# Patient Record
Sex: Female | Born: 1945 | Race: Black or African American | Hispanic: No | Marital: Single | Smoking: Former smoker
Health system: Southern US, Community
[De-identification: ages and names within clinical notes are randomized; demographics above are authoritative.]

## PROBLEM LIST (undated history)

## (undated) DIAGNOSIS — K6289 Other specified diseases of anus and rectum: Secondary | ICD-10-CM

## (undated) DIAGNOSIS — D649 Anemia, unspecified: Secondary | ICD-10-CM

## (undated) DIAGNOSIS — I10 Essential (primary) hypertension: Secondary | ICD-10-CM

## (undated) DIAGNOSIS — E785 Hyperlipidemia, unspecified: Secondary | ICD-10-CM

## (undated) DIAGNOSIS — J189 Pneumonia, unspecified organism: Secondary | ICD-10-CM

## (undated) DIAGNOSIS — M1990 Unspecified osteoarthritis, unspecified site: Secondary | ICD-10-CM

## (undated) DIAGNOSIS — M1712 Unilateral primary osteoarthritis, left knee: Secondary | ICD-10-CM

## (undated) HISTORY — DX: Other specified diseases of anus and rectum: K62.89

## (undated) HISTORY — DX: Anemia, unspecified: D64.9

## (undated) HISTORY — PX: COLONOSCOPY: SHX174

## (undated) HISTORY — DX: Essential (primary) hypertension: I10

## (undated) HISTORY — DX: Hyperlipidemia, unspecified: E78.5

---

## 2005-11-15 ENCOUNTER — Ambulatory Visit (HOSPITAL_COMMUNITY): Admission: RE | Admit: 2005-11-15 | Discharge: 2005-11-15 | Payer: Self-pay | Admitting: Gastroenterology

## 2005-11-15 ENCOUNTER — Encounter (INDEPENDENT_AMBULATORY_CARE_PROVIDER_SITE_OTHER): Payer: Self-pay | Admitting: *Deleted

## 2006-06-26 ENCOUNTER — Encounter: Admission: RE | Admit: 2006-06-26 | Discharge: 2006-06-26 | Payer: Self-pay | Admitting: Emergency Medicine

## 2006-11-19 ENCOUNTER — Emergency Department (HOSPITAL_COMMUNITY): Admission: EM | Admit: 2006-11-19 | Discharge: 2006-11-19 | Payer: Self-pay | Admitting: Emergency Medicine

## 2007-07-09 ENCOUNTER — Encounter: Admission: RE | Admit: 2007-07-09 | Discharge: 2007-07-09 | Payer: Self-pay | Admitting: Emergency Medicine

## 2007-07-18 ENCOUNTER — Encounter: Admission: RE | Admit: 2007-07-18 | Discharge: 2007-07-18 | Payer: Self-pay | Admitting: Surgery

## 2008-07-10 ENCOUNTER — Encounter: Admission: RE | Admit: 2008-07-10 | Discharge: 2008-07-10 | Payer: Self-pay | Admitting: Family Medicine

## 2008-07-18 ENCOUNTER — Encounter: Admission: RE | Admit: 2008-07-18 | Discharge: 2008-07-18 | Payer: Self-pay | Admitting: Family Medicine

## 2008-12-17 ENCOUNTER — Ambulatory Visit: Payer: Self-pay | Admitting: Family Medicine

## 2009-02-18 ENCOUNTER — Ambulatory Visit: Payer: Self-pay | Admitting: Family Medicine

## 2009-03-11 ENCOUNTER — Ambulatory Visit: Payer: Self-pay | Admitting: Family Medicine

## 2009-03-11 ENCOUNTER — Encounter: Admission: RE | Admit: 2009-03-11 | Discharge: 2009-03-11 | Payer: Self-pay | Admitting: Gastroenterology

## 2009-03-26 LAB — HM COLONOSCOPY

## 2009-04-01 ENCOUNTER — Encounter: Admission: RE | Admit: 2009-04-01 | Discharge: 2009-04-01 | Payer: Self-pay | Admitting: Gastroenterology

## 2009-06-10 ENCOUNTER — Ambulatory Visit: Payer: Self-pay | Admitting: Family Medicine

## 2009-10-08 ENCOUNTER — Ambulatory Visit: Payer: Self-pay | Admitting: Family Medicine

## 2009-10-09 ENCOUNTER — Ambulatory Visit: Payer: Self-pay | Admitting: Internal Medicine

## 2009-12-25 ENCOUNTER — Encounter
Admission: RE | Admit: 2009-12-25 | Discharge: 2009-12-25 | Payer: Self-pay | Source: Home / Self Care | Attending: Family Medicine | Admitting: Family Medicine

## 2009-12-25 LAB — HM MAMMOGRAPHY: HM Mammogram: NEGATIVE

## 2010-01-01 ENCOUNTER — Ambulatory Visit: Payer: Self-pay | Admitting: Family Medicine

## 2010-02-08 ENCOUNTER — Encounter: Payer: Self-pay | Admitting: Family Medicine

## 2010-05-04 ENCOUNTER — Encounter (INDEPENDENT_AMBULATORY_CARE_PROVIDER_SITE_OTHER): Payer: PRIVATE HEALTH INSURANCE | Admitting: Family Medicine

## 2010-05-04 DIAGNOSIS — E119 Type 2 diabetes mellitus without complications: Secondary | ICD-10-CM

## 2010-05-04 DIAGNOSIS — E785 Hyperlipidemia, unspecified: Secondary | ICD-10-CM

## 2010-05-04 DIAGNOSIS — I10 Essential (primary) hypertension: Secondary | ICD-10-CM

## 2010-05-04 DIAGNOSIS — D649 Anemia, unspecified: Secondary | ICD-10-CM

## 2010-05-04 DIAGNOSIS — Z79899 Other long term (current) drug therapy: Secondary | ICD-10-CM

## 2010-06-21 ENCOUNTER — Encounter: Payer: Self-pay | Admitting: Family Medicine

## 2010-08-16 ENCOUNTER — Encounter: Payer: Self-pay | Admitting: Family Medicine

## 2010-08-18 ENCOUNTER — Encounter: Payer: Self-pay | Admitting: Family Medicine

## 2010-08-18 ENCOUNTER — Ambulatory Visit (INDEPENDENT_AMBULATORY_CARE_PROVIDER_SITE_OTHER): Payer: PRIVATE HEALTH INSURANCE | Admitting: Family Medicine

## 2010-08-18 DIAGNOSIS — E785 Hyperlipidemia, unspecified: Secondary | ICD-10-CM | POA: Insufficient documentation

## 2010-08-18 DIAGNOSIS — E0840 Diabetes mellitus due to underlying condition with diabetic neuropathy, unspecified: Secondary | ICD-10-CM | POA: Insufficient documentation

## 2010-08-18 DIAGNOSIS — B351 Tinea unguium: Secondary | ICD-10-CM

## 2010-08-18 DIAGNOSIS — N898 Other specified noninflammatory disorders of vagina: Secondary | ICD-10-CM

## 2010-08-18 DIAGNOSIS — E119 Type 2 diabetes mellitus without complications: Secondary | ICD-10-CM

## 2010-08-18 DIAGNOSIS — E1169 Type 2 diabetes mellitus with other specified complication: Secondary | ICD-10-CM

## 2010-08-18 DIAGNOSIS — Z79899 Other long term (current) drug therapy: Secondary | ICD-10-CM

## 2010-08-18 DIAGNOSIS — D649 Anemia, unspecified: Secondary | ICD-10-CM

## 2010-08-18 DIAGNOSIS — I10 Essential (primary) hypertension: Secondary | ICD-10-CM

## 2010-08-18 LAB — CBC WITH DIFFERENTIAL/PLATELET
Basophils Absolute: 0 10*3/uL (ref 0.0–0.1)
Eosinophils Relative: 6 % — ABNORMAL HIGH (ref 0–5)
Lymphocytes Relative: 42 % (ref 12–46)
MCV: 101.7 fL — ABNORMAL HIGH (ref 78.0–100.0)
Neutro Abs: 1.9 10*3/uL (ref 1.7–7.7)
Neutrophils Relative %: 39 % — ABNORMAL LOW (ref 43–77)
Platelets: 427 10*3/uL — ABNORMAL HIGH (ref 150–400)
RBC: 2.89 MIL/uL — ABNORMAL LOW (ref 3.87–5.11)
RDW: 17.2 % — ABNORMAL HIGH (ref 11.5–15.5)
WBC: 4.9 10*3/uL (ref 4.0–10.5)

## 2010-08-18 LAB — LIPID PANEL
Cholesterol: 165 mg/dL (ref 0–200)
VLDL: 9 mg/dL (ref 0–40)

## 2010-08-18 MED ORDER — METFORMIN HCL 500 MG PO TABS: 500.0000 mg | ORAL_TABLET | Freq: Two times a day (BID) | ORAL | Status: DC

## 2010-08-18 NOTE — Progress Notes (Signed)
  Subjective:    Patient ID: Christine Silva, female    DOB: 12-06-1945, 65 y.o.   MRN: [id number]  HPI She is here for a diabetes recheck. She continues on medications listed in the chart. She has a history of anemia and is on a multivitamin. She is now taking Zantac to help with abdominal distress. It is working well for this. She also complains of a three-week history of vaginal discomfort and tenderness to palpation mainly on the left. She states that her blood sugars do fluctuate especially in the mornings. She also complains of thickening of her toenails. Her review of systems is otherwise negative.   Review of Systems     Objective:   Physical Exam Alert and in no distress. Vaginal exam does show a whitish ulcerated appearing lesion left of the urethra. Hemoglobin A1c in the office was in the low 4 range. Exam of her feet does show thickening of several of the nails.       Assessment & Plan:  Diabetes. Anemia. Hypertension. Hyperlipidemia. Onychomycosis. Vaginal lesion I will refer to gynecology to further evaluate the lesion. Routine blood screening and will recheck hemoglobin A1c. Discussed treatment of her toenails and at this time no further therapy will be initiated.

## 2010-08-18 NOTE — Patient Instructions (Signed)
Continue on present medications. We will refer you to gynecology.

## 2010-08-19 LAB — HEMOGLOBIN A1C: Hgb A1c MFr Bld: 4.3 % (ref <5.7)

## 2010-09-14 ENCOUNTER — Other Ambulatory Visit: Payer: Self-pay | Admitting: Obstetrics & Gynecology

## 2010-11-11 ENCOUNTER — Ambulatory Visit (INDEPENDENT_AMBULATORY_CARE_PROVIDER_SITE_OTHER): Payer: PRIVATE HEALTH INSURANCE | Admitting: Family Medicine

## 2010-11-11 ENCOUNTER — Encounter: Payer: Self-pay | Admitting: Family Medicine

## 2010-11-11 DIAGNOSIS — L03019 Cellulitis of unspecified finger: Secondary | ICD-10-CM

## 2010-11-11 NOTE — Progress Notes (Signed)
  Subjective:    Patient ID: Christine Silva, female    DOB: May 26, 1945, 65 y.o.   MRN: [id number]  HPI She is here for consultation concerning difficulty with her right thumb. Apparently at approximately 10 days ago she started having difficulty with swelling and made an attempt to remove something from her thumb. She mentioned previous difficulty with the, however history was difficult to obtain due to her accident. She does have underlying diabetes.   Review of Systems     Objective:   Physical Exam Exam of the right thumb does show erythema with recent trauma to the palmar surface. It is also swollen but nontender.       Assessment & Plan:  Possible, infection in this diabetic. I will refer to hand surgeon.

## 2010-11-16 ENCOUNTER — Encounter: Payer: Self-pay | Admitting: Family Medicine

## 2010-12-06 ENCOUNTER — Telehealth: Payer: Self-pay | Admitting: Family Medicine

## 2010-12-06 MED ORDER — MELOXICAM 15 MG PO TABS: 15.0000 mg | ORAL_TABLET | Freq: Every day | ORAL | Status: DC

## 2010-12-06 NOTE — Telephone Encounter (Signed)
Is this okay to refill? 

## 2010-12-06 NOTE — Telephone Encounter (Signed)
Refilled mexloxicam thru [facility]

## 2010-12-06 NOTE — Telephone Encounter (Signed)
Renew the meloxicam

## 2011-01-06 ENCOUNTER — Ambulatory Visit (INDEPENDENT_AMBULATORY_CARE_PROVIDER_SITE_OTHER): Payer: PRIVATE HEALTH INSURANCE | Admitting: Family Medicine

## 2011-01-06 ENCOUNTER — Encounter: Payer: Self-pay | Admitting: Family Medicine

## 2011-01-06 VITALS — BP 120/70 | HR 67 | Wt 172.0 lb

## 2011-01-06 DIAGNOSIS — E785 Hyperlipidemia, unspecified: Secondary | ICD-10-CM

## 2011-01-06 DIAGNOSIS — E1149 Type 2 diabetes mellitus with other diabetic neurological complication: Secondary | ICD-10-CM

## 2011-01-06 DIAGNOSIS — E0840 Diabetes mellitus due to underlying condition with diabetic neuropathy, unspecified: Secondary | ICD-10-CM

## 2011-01-06 DIAGNOSIS — D649 Anemia, unspecified: Secondary | ICD-10-CM

## 2011-01-06 DIAGNOSIS — I10 Essential (primary) hypertension: Secondary | ICD-10-CM

## 2011-01-06 DIAGNOSIS — E1169 Type 2 diabetes mellitus with other specified complication: Secondary | ICD-10-CM

## 2011-01-06 DIAGNOSIS — E1159 Type 2 diabetes mellitus with other circulatory complications: Secondary | ICD-10-CM

## 2011-01-06 DIAGNOSIS — E119 Type 2 diabetes mellitus without complications: Secondary | ICD-10-CM

## 2011-01-06 DIAGNOSIS — E1142 Type 2 diabetes mellitus with diabetic polyneuropathy: Secondary | ICD-10-CM

## 2011-01-06 DIAGNOSIS — E1349 Other specified diabetes mellitus with other diabetic neurological complication: Secondary | ICD-10-CM

## 2011-01-06 LAB — CBC WITH DIFFERENTIAL/PLATELET
HCT: 28.5 % — ABNORMAL LOW (ref 36.0–46.0)
Hemoglobin: 9 g/dL — ABNORMAL LOW (ref 12.0–15.0)
Lymphs Abs: 1.7 10*3/uL (ref 0.7–4.0)
MCH: 32.4 pg (ref 26.0–34.0)
Monocytes Relative: 13 % — ABNORMAL HIGH (ref 3–12)
Neutro Abs: 2 10*3/uL (ref 1.7–7.7)
Neutrophils Relative %: 43 % (ref 43–77)
RBC: 2.78 MIL/uL — ABNORMAL LOW (ref 3.87–5.11)

## 2011-01-06 NOTE — Progress Notes (Signed)
  Subjective:    Patient ID: Christine Silva, female    DOB: 01-14-1946, 65 y.o.   MRN: [id number]  HPI She is here for a diabetes followup. She continues on her insulin and other medications. She does periodically check her blood sugars. She keeps herself very active. She does not smoke or drink. She had an eye exam in September. She does take multivitamins. She is planning a cruise in the [place] in the near future.   Review of Systems     Objective:   Physical Exam Alert and in no distress. Exam of her feet does show 1+ pulses with absence of ankle reflex. Her station is normal. Skin is normal.       Assessment & Plan:   1. Type II or unspecified type diabetes mellitus without mention of complication, not stated as uncontrolled  Hemoglobin A1c  2. Anemia  CBC with Differential  3. Hyperlipidemia LDL goal <70    4. Hypertension associated with diabetes    5. Diabetes mellitus due to underlying condition with diabetic neuropathy     encouraged her to continue to take excellent care of her self.

## 2011-01-07 ENCOUNTER — Telehealth: Payer: Self-pay

## 2011-01-07 LAB — HEMOGLOBIN A1C: Mean Plasma Glucose: 97 mg/dL (ref <117)

## 2011-01-07 NOTE — Telephone Encounter (Signed)
Pt needs letter to get meds on plane call when ready

## 2011-01-07 NOTE — Telephone Encounter (Signed)
Letter complete.

## 2011-02-08 ENCOUNTER — Other Ambulatory Visit: Payer: Self-pay | Admitting: Family Medicine

## 2011-02-27 ENCOUNTER — Other Ambulatory Visit: Payer: Self-pay | Admitting: Family Medicine

## 2011-03-03 ENCOUNTER — Other Ambulatory Visit: Payer: Self-pay | Admitting: Family Medicine

## 2011-03-03 DIAGNOSIS — Z1231 Encounter for screening mammogram for malignant neoplasm of breast: Secondary | ICD-10-CM

## 2011-04-01 ENCOUNTER — Other Ambulatory Visit: Payer: Self-pay | Admitting: Family Medicine

## 2011-04-01 NOTE — Telephone Encounter (Signed)
Is this okay to refill meds? Also needs a refill on metformin

## 2011-04-06 ENCOUNTER — Ambulatory Visit
Admission: RE | Admit: 2011-04-06 | Discharge: 2011-04-06 | Disposition: A | Discharge status: Home / Self Care | Payer: PRIVATE HEALTH INSURANCE | Source: Ambulatory Visit | Attending: Family Medicine | Admitting: Family Medicine

## 2011-04-06 DIAGNOSIS — Z1231 Encounter for screening mammogram for malignant neoplasm of breast: Secondary | ICD-10-CM

## 2011-04-11 ENCOUNTER — Other Ambulatory Visit: Payer: Self-pay | Admitting: Family Medicine

## 2011-04-11 DIAGNOSIS — R928 Other abnormal and inconclusive findings on diagnostic imaging of breast: Secondary | ICD-10-CM

## 2011-04-14 ENCOUNTER — Ambulatory Visit
Admission: RE | Admit: 2011-04-14 | Discharge: 2011-04-14 | Disposition: A | Discharge status: Home / Self Care | Payer: PRIVATE HEALTH INSURANCE | Source: Ambulatory Visit | Attending: Family Medicine | Admitting: Family Medicine

## 2011-04-14 DIAGNOSIS — R928 Other abnormal and inconclusive findings on diagnostic imaging of breast: Secondary | ICD-10-CM

## 2011-04-29 ENCOUNTER — Encounter: Payer: Self-pay | Admitting: Family Medicine

## 2011-04-29 ENCOUNTER — Ambulatory Visit (INDEPENDENT_AMBULATORY_CARE_PROVIDER_SITE_OTHER): Payer: PRIVATE HEALTH INSURANCE | Admitting: Family Medicine

## 2011-04-29 ENCOUNTER — Ambulatory Visit
Admission: RE | Admit: 2011-04-29 | Discharge: 2011-04-29 | Disposition: A | Discharge status: Home / Self Care | Payer: PRIVATE HEALTH INSURANCE | Source: Ambulatory Visit | Attending: Family Medicine | Admitting: Family Medicine

## 2011-04-29 VITALS — BP 110/60 | HR 72 | Ht 64.0 in | Wt 168.0 lb

## 2011-04-29 DIAGNOSIS — M129 Arthropathy, unspecified: Secondary | ICD-10-CM

## 2011-04-29 DIAGNOSIS — K219 Gastro-esophageal reflux disease without esophagitis: Secondary | ICD-10-CM

## 2011-04-29 DIAGNOSIS — E1169 Type 2 diabetes mellitus with other specified complication: Secondary | ICD-10-CM

## 2011-04-29 DIAGNOSIS — E785 Hyperlipidemia, unspecified: Secondary | ICD-10-CM

## 2011-04-29 DIAGNOSIS — M1990 Unspecified osteoarthritis, unspecified site: Secondary | ICD-10-CM

## 2011-04-29 DIAGNOSIS — I10 Essential (primary) hypertension: Secondary | ICD-10-CM

## 2011-04-29 DIAGNOSIS — E119 Type 2 diabetes mellitus without complications: Secondary | ICD-10-CM

## 2011-04-29 MED ORDER — INSULIN GLARGINE 100 UNIT/ML ~~LOC~~ SOLN: 10.0000 [IU] | Freq: Every day | SUBCUTANEOUS | Status: DC

## 2011-04-29 MED ORDER — LOSARTAN POTASSIUM-HCTZ 50-12.5 MG PO TABS: 1.0000 | ORAL_TABLET | Freq: Every day | ORAL | Status: DC

## 2011-04-29 MED ORDER — METFORMIN HCL 500 MG PO TABS: 500.0000 mg | ORAL_TABLET | Freq: Two times a day (BID) | ORAL | Status: DC

## 2011-04-29 MED ORDER — PRAVASTATIN SODIUM 40 MG PO TABS: 40.0000 mg | ORAL_TABLET | Freq: Every day | ORAL | Status: DC

## 2011-04-29 MED ORDER — DICLOFENAC SODIUM 75 MG PO TBEC: 75.0000 mg | DELAYED_RELEASE_TABLET | Freq: Two times a day (BID) | ORAL | Status: DC

## 2011-04-29 MED ORDER — RANITIDINE HCL 150 MG PO TABS: 150.0000 mg | ORAL_TABLET | Freq: Every day | ORAL | Status: DC

## 2011-04-29 NOTE — Progress Notes (Signed)
  Subjective:    Patient ID: Christine Silva, female    DOB: Sep 02, 1945, 66 y.o.   MRN: [id number]  HPI She is here for recheck. She continues to complain of bilateral knee pain as well as wrist and elbow discomfort. She states that the meloxicam is not helping control her symptoms. She continues on her reflux medicine and uses this as needed. She does check her blood sugars fairly regularly. She continues to work and is quite active. Smoking and drinking were reviewed. She did have a mammogram and is scheduled to return in 6 months for recheck.   Review of Systems     Objective:   Physical Exam Alert and in no distress. Cardiac exam shows regular rhythm without murmurs or gallops. Lungs clear to auscultation. Hemoglobin A1c is 4.7. Review of record indicates previous A1c was 5.0 Hemoglobin A1c 4.7       Assessment & Plan:   1. Type II or unspecified type diabetes mellitus without mention of complication, not stated as uncontrolled  POCT HgB A1C, metFORMIN (GLUCOPHAGE) 500 MG tablet  2. Hyperlipidemia LDL goal <70  pravastatin (PRAVACHOL) 40 MG tablet  3. Hypertension associated with diabetes  losartan-hydrochlorothiazide (HYZAAR) 50-12.5 MG per tablet  4. Arthritis  DG Knee 1-2 Views Left, DG Knee 1-2 Views Right, diclofenac (VOLTAREN) 75 MG EC tablet  5. GERD (gastroesophageal reflux disease)  ranitidine (ZANTAC) 150 MG tablet   I will have her back off on Lantus to 10 units and call me with her blood sugar readings in one week. I will switch her to Voltaren. Discussed degenerative joint disease and the possibility of joint replacement at some point in the future. Recheck here in several months.

## 2011-05-16 ENCOUNTER — Telehealth: Payer: Self-pay

## 2011-05-16 NOTE — Telephone Encounter (Signed)
Pt sent B/S readings jcl wanted to know how she was doing I called and left messge for pt to ask how she was doing

## 2011-05-19 ENCOUNTER — Telehealth: Payer: Self-pay

## 2011-05-19 NOTE — Telephone Encounter (Signed)
Pt called and said that she was ok and that she went back to 20 units said 10 units made her B/S reading were up and down and on 20 units it is steady

## 2011-06-02 ENCOUNTER — Other Ambulatory Visit: Payer: Self-pay | Admitting: Family Medicine

## 2011-07-22 ENCOUNTER — Encounter: Payer: Self-pay | Admitting: Family Medicine

## 2011-08-23 ENCOUNTER — Encounter: Payer: Self-pay | Admitting: Internal Medicine

## 2011-08-23 ENCOUNTER — Encounter: Payer: Self-pay | Admitting: Family Medicine

## 2011-08-23 ENCOUNTER — Ambulatory Visit (INDEPENDENT_AMBULATORY_CARE_PROVIDER_SITE_OTHER): Payer: PRIVATE HEALTH INSURANCE | Admitting: Family Medicine

## 2011-08-23 VITALS — BP 100/50 | HR 60 | Wt 161.0 lb

## 2011-08-23 DIAGNOSIS — E1159 Type 2 diabetes mellitus with other circulatory complications: Secondary | ICD-10-CM

## 2011-08-23 DIAGNOSIS — E1169 Type 2 diabetes mellitus with other specified complication: Secondary | ICD-10-CM

## 2011-08-23 DIAGNOSIS — E785 Hyperlipidemia, unspecified: Secondary | ICD-10-CM

## 2011-08-23 DIAGNOSIS — M1990 Unspecified osteoarthritis, unspecified site: Secondary | ICD-10-CM

## 2011-08-23 DIAGNOSIS — M25569 Pain in unspecified knee: Secondary | ICD-10-CM

## 2011-08-23 DIAGNOSIS — Z79899 Other long term (current) drug therapy: Secondary | ICD-10-CM

## 2011-08-23 DIAGNOSIS — M129 Arthropathy, unspecified: Secondary | ICD-10-CM

## 2011-08-23 DIAGNOSIS — D649 Anemia, unspecified: Secondary | ICD-10-CM

## 2011-08-23 DIAGNOSIS — I10 Essential (primary) hypertension: Secondary | ICD-10-CM

## 2011-08-23 DIAGNOSIS — K6289 Other specified diseases of anus and rectum: Secondary | ICD-10-CM

## 2011-08-23 DIAGNOSIS — E119 Type 2 diabetes mellitus without complications: Secondary | ICD-10-CM

## 2011-08-23 LAB — CBC WITH DIFFERENTIAL/PLATELET
HCT: 27.1 % — ABNORMAL LOW (ref 36.0–46.0)
Hemoglobin: 9.1 g/dL — ABNORMAL LOW (ref 12.0–15.0)
Lymphocytes Relative: 43 % (ref 12–46)
Lymphs Abs: 2 10*3/uL (ref 0.7–4.0)
MCHC: 33.6 g/dL (ref 30.0–36.0)
Monocytes Absolute: 0.5 10*3/uL (ref 0.1–1.0)
Monocytes Relative: 11 % (ref 3–12)
Neutro Abs: 1.8 10*3/uL (ref 1.7–7.7)
Neutrophils Relative %: 39 % — ABNORMAL LOW (ref 43–77)
RBC: 2.83 MIL/uL — ABNORMAL LOW (ref 3.87–5.11)
WBC: 4.6 10*3/uL (ref 4.0–10.5)

## 2011-08-23 LAB — LIPID PANEL
HDL: 64 mg/dL (ref >39)
LDL Cholesterol: 87 mg/dL (ref 0–99)
Triglycerides: 54 mg/dL (ref <150)

## 2011-08-23 LAB — COMPREHENSIVE METABOLIC PANEL
Albumin: 4.1 g/dL (ref 3.5–5.2)
BUN: 18 mg/dL (ref 6–23)
CO2: 26 mEq/L (ref 19–32)
Calcium: 9.8 mg/dL (ref 8.4–10.5)
Chloride: 103 mEq/L (ref 96–112)
Glucose, Bld: 129 mg/dL — ABNORMAL HIGH (ref 70–99)
Potassium: 4.1 mEq/L (ref 3.5–5.3)
Sodium: 138 mEq/L (ref 135–145)
Total Protein: 6.3 g/dL (ref 6.0–8.3)

## 2011-08-23 LAB — POCT GLYCOSYLATED HEMOGLOBIN (HGB A1C): Hemoglobin A1C: 4.8

## 2011-08-23 MED ORDER — DICLOFENAC SODIUM 1 % TD GEL: 1.0000 "application " | Freq: Four times a day (QID) | TRANSDERMAL | Status: DC

## 2011-08-23 NOTE — Patient Instructions (Addendum)
Stop the Lantus but continue the metformin twice per day. He checking your blood sugars and if they start to get above 180 let me know

## 2011-08-23 NOTE — Progress Notes (Signed)
  Subjective:    Patient ID: Christine Silva, female    DOB: 05-11-1945, 66 y.o.   MRN: [id number]  HPI He is here for recheck. She continues on medications listed in the chart. She states that the Voltaire and has not helped with her knee pain. Review of the x-rays indicates arthritic changes. She is getting ready to go on a trip and would like help with this. Her blood sugars are doing quite well. She has an eye exam set up in September. She does check her feet. Smoking and drinking were reviewed. Her medical record also indicates a slightly low hemoglobin.  Review of Systems     Objective:   Physical Exam Alert and in no distress. Hemoglobin A1c is 4.8. Left knee exam does show an effusion. There is evidence of a popliteal cyst with swelling in the popliteal area and slight tenderness. No laxity noted.       Assessment & Plan:   1. Diabetes mellitus  POCT glycosylated hemoglobin (Hb A1C), Lipid panel  2. Arthritis  diclofenac sodium (VOLTAREN) 1 % GEL  3. Hyperlipidemia LDL goal <70  Lipid panel  4. Hypertension associated with diabetes  CBC with Differential, Comprehensive metabolic panel  5. Anemia  CBC with Differential  6. Encounter for long-term (current) use of other medications  CBC with Differential, Comprehensive metabolic panel, Lipid panel   I discussed options concerning her knee pain. We've decided to inject this. 40 mg of Kenalog and 3 cc of Xylocaine was injected into the knee laterally. It was difficult to do due to patient's movement however she did obtain relief of her pain indicating penetration into the joint space. Discussed the fact that we will see how long the pain relief lasts. Discussed possible referral to orthopedics depending upon her response. Also recommend stopping her Lantus since her A1c is doing quite well. She is to keep track of her blood sugars.

## 2011-08-24 ENCOUNTER — Telehealth: Payer: Self-pay

## 2011-08-24 NOTE — Telephone Encounter (Signed)
Pt called and said that B/S was 230 this morning what should she do because she said you told her if it got above 150 to let you know please advise

## 2011-08-24 NOTE — Progress Notes (Signed)
Left message labs look good and to stay on a GOOD multi with iron

## 2011-08-25 NOTE — Telephone Encounter (Signed)
Her body might need to readjust since she's no longer taking the Lantus. Have her watch this through the weekend and call Monday to let me know what the numbers are.

## 2011-08-25 NOTE — Telephone Encounter (Signed)
Called pt cell # left message to keep up with numbers over weekend to call me and let me know what they are Monday it may just be her body getting situated due to being of insulin

## 2011-08-29 ENCOUNTER — Telehealth: Payer: Self-pay

## 2011-08-29 NOTE — Telephone Encounter (Signed)
Dr.Lalonde Nicole Kindred called and said B/S readings fri/[id number] please advise

## 2011-08-29 NOTE — Telephone Encounter (Signed)
Have her start back on the insulin 10 unit

## 2011-08-29 NOTE — Telephone Encounter (Signed)
Called pt back to inform her to go back on her insulin left message for pt

## 2011-08-31 ENCOUNTER — Telehealth: Payer: Self-pay

## 2011-08-31 NOTE — Telephone Encounter (Signed)
Called pt at work I have left pt 3 messages to go back on her insulin per JCL 10 units at night due to high B/S readings called pt at work and pt verbalized understanding

## 2011-09-16 ENCOUNTER — Other Ambulatory Visit: Payer: Self-pay | Admitting: Family Medicine

## 2011-09-16 DIAGNOSIS — R921 Mammographic calcification found on diagnostic imaging of breast: Secondary | ICD-10-CM

## 2011-09-20 ENCOUNTER — Ambulatory Visit
Admission: RE | Admit: 2011-09-20 | Discharge: 2011-09-20 | Disposition: A | Discharge status: Home / Self Care | Payer: PRIVATE HEALTH INSURANCE | Source: Ambulatory Visit | Attending: Family Medicine | Admitting: Family Medicine

## 2011-09-20 DIAGNOSIS — R921 Mammographic calcification found on diagnostic imaging of breast: Secondary | ICD-10-CM

## 2011-09-27 ENCOUNTER — Encounter: Payer: Self-pay | Admitting: Family Medicine

## 2011-09-27 ENCOUNTER — Ambulatory Visit (INDEPENDENT_AMBULATORY_CARE_PROVIDER_SITE_OTHER): Payer: PRIVATE HEALTH INSURANCE | Admitting: Family Medicine

## 2011-09-27 VITALS — BP 110/60 | HR 60 | Wt 161.0 lb

## 2011-09-27 DIAGNOSIS — M25562 Pain in left knee: Secondary | ICD-10-CM

## 2011-09-27 DIAGNOSIS — M25569 Pain in unspecified knee: Secondary | ICD-10-CM

## 2011-09-27 MED ORDER — TRAMADOL HCL 50 MG PO TABS: 50.0000 mg | ORAL_TABLET | Freq: Three times a day (TID) | ORAL | Status: AC | PRN

## 2011-09-27 NOTE — Progress Notes (Signed)
  Subjective:    Patient ID: Christine Silva, female    DOB: 06-Jul-1945, 66 y.o.   MRN: [id number]  HPI She is here for recheck. She states that the ingestion gave her pain relief for only 2 days. Since then she has continued had difficulty especially in the posterior aspect of her knee. Pain occurs with any kind of motion.   Review of Systems     Objective:   Physical Exam Pain on motion of the knee with no palpable tenderness. No effusion is noted. No laxity is noted.       Assessment & Plan:   1. Left knee pain  traMADol (ULTRAM) 50 MG tablet   I explained that since the injection really didn't last for a long, repeat would not be that useful and she agrees. Discussed orthopedic referral however she is going back to [place] for several weeks. I will give her tramadol and refer her to orthopedics. She is to call me tomorrow and let me know how the pain medicine works

## 2011-09-27 NOTE — Patient Instructions (Signed)
Call me tomorrow and let me know how the pain medication works

## 2011-09-28 ENCOUNTER — Telehealth: Payer: Self-pay | Admitting: Family Medicine

## 2011-09-28 NOTE — Telephone Encounter (Signed)
Patient states she was told to call today to update on how she is doing She states she is the same, meds are not helping  Please call

## 2011-09-29 NOTE — Telephone Encounter (Signed)
Patient will make appt when she gets back from trip

## 2011-09-29 NOTE — Telephone Encounter (Signed)
She is apparently planning a trip tell her that we can set up an appointment based on that

## 2011-12-01 ENCOUNTER — Other Ambulatory Visit: Payer: Self-pay | Admitting: Family Medicine

## 2011-12-22 ENCOUNTER — Ambulatory Visit (INDEPENDENT_AMBULATORY_CARE_PROVIDER_SITE_OTHER): Payer: PRIVATE HEALTH INSURANCE | Admitting: Family Medicine

## 2011-12-22 ENCOUNTER — Encounter: Payer: Self-pay | Admitting: Family Medicine

## 2011-12-22 VITALS — BP 130/70 | HR 92 | Temp 98.1°F | Resp 16 | Wt 160.0 lb

## 2011-12-22 DIAGNOSIS — E785 Hyperlipidemia, unspecified: Secondary | ICD-10-CM

## 2011-12-22 DIAGNOSIS — M1990 Unspecified osteoarthritis, unspecified site: Secondary | ICD-10-CM

## 2011-12-22 DIAGNOSIS — D649 Anemia, unspecified: Secondary | ICD-10-CM

## 2011-12-22 DIAGNOSIS — M129 Arthropathy, unspecified: Secondary | ICD-10-CM

## 2011-12-22 DIAGNOSIS — I10 Essential (primary) hypertension: Secondary | ICD-10-CM

## 2011-12-22 DIAGNOSIS — E1142 Type 2 diabetes mellitus with diabetic polyneuropathy: Secondary | ICD-10-CM

## 2011-12-22 DIAGNOSIS — E1169 Type 2 diabetes mellitus with other specified complication: Secondary | ICD-10-CM

## 2011-12-22 DIAGNOSIS — E0840 Diabetes mellitus due to underlying condition with diabetic neuropathy, unspecified: Secondary | ICD-10-CM

## 2011-12-22 DIAGNOSIS — E1349 Other specified diabetes mellitus with other diabetic neurological complication: Secondary | ICD-10-CM

## 2011-12-22 LAB — CBC WITH DIFFERENTIAL/PLATELET
Basophils Relative: 1 % (ref 0–1)
HCT: 27.4 % — ABNORMAL LOW (ref 36.0–46.0)
Hemoglobin: 9.4 g/dL — ABNORMAL LOW (ref 12.0–15.0)
Lymphs Abs: 1.7 10*3/uL (ref 0.7–4.0)
MCH: 32.6 pg (ref 26.0–34.0)
MCHC: 34.3 g/dL (ref 30.0–36.0)
Monocytes Absolute: 0.6 10*3/uL (ref 0.1–1.0)
Monocytes Relative: 11 % (ref 3–12)
Neutro Abs: 2.6 10*3/uL (ref 1.7–7.7)
Neutrophils Relative %: 49 % (ref 43–77)
RBC: 2.88 MIL/uL — ABNORMAL LOW (ref 3.87–5.11)

## 2011-12-22 LAB — POCT GLYCOSYLATED HEMOGLOBIN (HGB A1C): Hemoglobin A1C: 4.5

## 2011-12-22 MED ORDER — INSULIN GLARGINE 100 UNIT/ML ~~LOC~~ SOLN: 10.0000 [IU] | Freq: Every day | SUBCUTANEOUS | Status: DC

## 2011-12-22 MED ORDER — METFORMIN HCL 500 MG PO TABS: 500.0000 mg | ORAL_TABLET | Freq: Two times a day (BID) | ORAL | Status: DC

## 2011-12-22 NOTE — Patient Instructions (Signed)
Take 10 units of the insulin and check your blood sugar in the morning when you get up and then during the day check in 2 hours after a lunch and dinner do this for the next week and let me know what those numbers are

## 2011-12-22 NOTE — Progress Notes (Signed)
  Subjective:    Patient ID: Christine Silva, female    DOB: 10-29-1945, 66 y.o.   MRN: [id number]  HPI She is here for recheck. She has been having difficulty recently with knee arthritis. She has had aspiration as well as injection and presently is involved in physical therapy. She continues to have difficulty with this. She is using meloxicam with some relief of her knee pain. She checks her blood sugars regularly usually in the morning and notes blood sugars in the 150 range. She is now taking 20 units of insulin as well as metformin. Smoking and drinking were reviewed. She continues on medications listed in the chart. She does check her feet regularly. Does have a previous history of anemia  Review of Systems     Objective:   Physical Exam Alert and in no distress. Hemoglobin A1c is 4.5. She has already received the flu shot      Assessment & Plan:   1. Diabetes mellitus due to underlying condition with diabetic neuropathy  HgB A1c, metFORMIN (GLUCOPHAGE) 500 MG tablet, insulin glargine (LANTUS SOLOSTAR) 100 UNIT/ML injection  2. Hypertension associated with diabetes    3. Anemia  CBC with Differential  4. Hyperlipidemia LDL goal <70    5. Arthritis     I recommended that she cut back to 10 units of insulin and to check her blood sugar in the morning as well as 2 hours after lunch and dinner for the next week and let me know what these readings are. There is a definite discrepancy between her A1c in the numbers she is "in. Also discussed her arthritis and mention the fact that joint replacement is certainly a viable option.

## 2012-02-18 DIAGNOSIS — J189 Pneumonia, unspecified organism: Secondary | ICD-10-CM

## 2012-02-18 HISTORY — DX: Pneumonia, unspecified organism: J18.9

## 2012-03-01 ENCOUNTER — Encounter (HOSPITAL_COMMUNITY): Payer: Self-pay | Admitting: Emergency Medicine

## 2012-03-01 ENCOUNTER — Inpatient Hospital Stay (HOSPITAL_COMMUNITY)
Admission: EM | Admit: 2012-03-01 | Discharge: 2012-03-07 | DRG: 186 | Disposition: A | Discharge status: Home / Self Care | Payer: Medicare Other | Attending: Internal Medicine | Admitting: Internal Medicine

## 2012-03-01 ENCOUNTER — Emergency Department (HOSPITAL_COMMUNITY): Payer: Medicare Other

## 2012-03-01 ENCOUNTER — Emergency Department (HOSPITAL_COMMUNITY)
Admission: EM | Admit: 2012-03-01 | Discharge: 2012-03-01 | Disposition: A | Discharge status: Home / Self Care | Payer: Medicare Other | Source: Home / Self Care | Attending: Emergency Medicine | Admitting: Emergency Medicine

## 2012-03-01 DIAGNOSIS — R933 Abnormal findings on diagnostic imaging of other parts of digestive tract: Secondary | ICD-10-CM

## 2012-03-01 DIAGNOSIS — E1169 Type 2 diabetes mellitus with other specified complication: Secondary | ICD-10-CM | POA: Insufficient documentation

## 2012-03-01 DIAGNOSIS — I10 Essential (primary) hypertension: Secondary | ICD-10-CM | POA: Diagnosis present

## 2012-03-01 DIAGNOSIS — Z791 Long term (current) use of non-steroidal anti-inflammatories (NSAID): Secondary | ICD-10-CM | POA: Insufficient documentation

## 2012-03-01 DIAGNOSIS — D539 Nutritional anemia, unspecified: Secondary | ICD-10-CM

## 2012-03-01 DIAGNOSIS — D649 Anemia, unspecified: Secondary | ICD-10-CM

## 2012-03-01 DIAGNOSIS — J189 Pneumonia, unspecified organism: Secondary | ICD-10-CM

## 2012-03-01 DIAGNOSIS — R0602 Shortness of breath: Secondary | ICD-10-CM

## 2012-03-01 DIAGNOSIS — R109 Unspecified abdominal pain: Secondary | ICD-10-CM

## 2012-03-01 DIAGNOSIS — K5900 Constipation, unspecified: Secondary | ICD-10-CM

## 2012-03-01 DIAGNOSIS — D75839 Thrombocytosis, unspecified: Secondary | ICD-10-CM

## 2012-03-01 DIAGNOSIS — Z794 Long term (current) use of insulin: Secondary | ICD-10-CM

## 2012-03-01 DIAGNOSIS — E785 Hyperlipidemia, unspecified: Secondary | ICD-10-CM

## 2012-03-01 DIAGNOSIS — K811 Chronic cholecystitis: Secondary | ICD-10-CM

## 2012-03-01 DIAGNOSIS — E1149 Type 2 diabetes mellitus with other diabetic neurological complication: Secondary | ICD-10-CM | POA: Diagnosis present

## 2012-03-01 DIAGNOSIS — N179 Acute kidney failure, unspecified: Secondary | ICD-10-CM

## 2012-03-01 DIAGNOSIS — R1084 Generalized abdominal pain: Secondary | ICD-10-CM | POA: Insufficient documentation

## 2012-03-01 DIAGNOSIS — Z8719 Personal history of other diseases of the digestive system: Secondary | ICD-10-CM | POA: Insufficient documentation

## 2012-03-01 DIAGNOSIS — E871 Hypo-osmolality and hyponatremia: Secondary | ICD-10-CM

## 2012-03-01 DIAGNOSIS — D72829 Elevated white blood cell count, unspecified: Secondary | ICD-10-CM

## 2012-03-01 DIAGNOSIS — Z79899 Other long term (current) drug therapy: Secondary | ICD-10-CM

## 2012-03-01 DIAGNOSIS — R110 Nausea: Secondary | ICD-10-CM | POA: Insufficient documentation

## 2012-03-01 DIAGNOSIS — K6289 Other specified diseases of anus and rectum: Secondary | ICD-10-CM

## 2012-03-01 DIAGNOSIS — J90 Pleural effusion, not elsewhere classified: Principal | ICD-10-CM

## 2012-03-01 DIAGNOSIS — I152 Hypertension secondary to endocrine disorders: Secondary | ICD-10-CM

## 2012-03-01 DIAGNOSIS — Z862 Personal history of diseases of the blood and blood-forming organs and certain disorders involving the immune mechanism: Secondary | ICD-10-CM | POA: Insufficient documentation

## 2012-03-01 DIAGNOSIS — I313 Pericardial effusion (noninflammatory): Secondary | ICD-10-CM

## 2012-03-01 DIAGNOSIS — J9819 Other pulmonary collapse: Secondary | ICD-10-CM | POA: Diagnosis present

## 2012-03-01 DIAGNOSIS — E1142 Type 2 diabetes mellitus with diabetic polyneuropathy: Secondary | ICD-10-CM | POA: Diagnosis present

## 2012-03-01 DIAGNOSIS — M1990 Unspecified osteoarthritis, unspecified site: Secondary | ICD-10-CM

## 2012-03-01 DIAGNOSIS — E860 Dehydration: Secondary | ICD-10-CM

## 2012-03-01 DIAGNOSIS — Z7982 Long term (current) use of aspirin: Secondary | ICD-10-CM | POA: Insufficient documentation

## 2012-03-01 DIAGNOSIS — R739 Hyperglycemia, unspecified: Secondary | ICD-10-CM

## 2012-03-01 DIAGNOSIS — E1159 Type 2 diabetes mellitus with other circulatory complications: Secondary | ICD-10-CM

## 2012-03-01 DIAGNOSIS — R188 Other ascites: Secondary | ICD-10-CM | POA: Diagnosis present

## 2012-03-01 DIAGNOSIS — T3995XA Adverse effect of unspecified nonopioid analgesic, antipyretic and antirheumatic, initial encounter: Secondary | ICD-10-CM | POA: Diagnosis present

## 2012-03-01 DIAGNOSIS — E0840 Diabetes mellitus due to underlying condition with diabetic neuropathy, unspecified: Secondary | ICD-10-CM

## 2012-03-01 DIAGNOSIS — D473 Essential (hemorrhagic) thrombocythemia: Secondary | ICD-10-CM

## 2012-03-01 DIAGNOSIS — I319 Disease of pericardium, unspecified: Secondary | ICD-10-CM | POA: Diagnosis present

## 2012-03-01 DIAGNOSIS — I3139 Other pericardial effusion (noninflammatory): Secondary | ICD-10-CM

## 2012-03-01 DIAGNOSIS — J3489 Other specified disorders of nose and nasal sinuses: Secondary | ICD-10-CM | POA: Diagnosis present

## 2012-03-01 LAB — URINALYSIS, ROUTINE W REFLEX MICROSCOPIC
Glucose, UA: 500 mg/dL — AB
Leukocytes, UA: NEGATIVE
pH: 5 (ref 5.0–8.0)

## 2012-03-01 LAB — CBC WITH DIFFERENTIAL/PLATELET
Eosinophils Absolute: 0.1 10*3/uL (ref 0.0–0.7)
MCH: 35 pg — ABNORMAL HIGH (ref 26.0–34.0)
MCHC: 34.7 g/dL (ref 30.0–36.0)
Monocytes Absolute: 0.7 10*3/uL (ref 0.1–1.0)
Neutrophils Relative %: 85 % — ABNORMAL HIGH (ref 43–77)
Platelets: 649 10*3/uL — ABNORMAL HIGH (ref 150–400)
RBC: 2.94 MIL/uL — ABNORMAL LOW (ref 3.87–5.11)
RDW: 19.6 % — ABNORMAL HIGH (ref 11.5–15.5)
WBC: 13.1 10*3/uL — ABNORMAL HIGH (ref 4.0–10.5)

## 2012-03-01 LAB — LACTIC ACID, PLASMA: Lactic Acid, Venous: 1.9 mmol/L (ref 0.5–2.2)

## 2012-03-01 LAB — COMPREHENSIVE METABOLIC PANEL
ALT: 9 U/L (ref 0–35)
Albumin: 3.6 g/dL (ref 3.5–5.2)
Alkaline Phosphatase: 73 U/L (ref 39–117)
Potassium: 4.6 mEq/L (ref 3.5–5.1)
Sodium: 130 mEq/L — ABNORMAL LOW (ref 135–145)
Total Protein: 6.6 g/dL (ref 6.0–8.3)

## 2012-03-01 LAB — LIPASE, BLOOD: Lipase: 27 U/L (ref 11–59)

## 2012-03-01 LAB — OCCULT BLOOD, POC DEVICE: Fecal Occult Bld: NEGATIVE

## 2012-03-01 MED ORDER — MORPHINE SULFATE 4 MG/ML IJ SOLN
4.0000 mg | Freq: Once | INTRAMUSCULAR | Status: AC
  Administered 2012-03-01: 4 mg via INTRAVENOUS
  Filled 2012-03-01 (×2): qty 1

## 2012-03-01 MED ORDER — CIPROFLOXACIN HCL 500 MG PO TABS: 500.0000 mg | ORAL_TABLET | Freq: Two times a day (BID) | ORAL | Status: DC

## 2012-03-01 MED ORDER — ONDANSETRON 8 MG PO TBDP: 8.0000 mg | ORAL_TABLET | Freq: Three times a day (TID) | ORAL | Status: DC | PRN

## 2012-03-01 MED ORDER — METRONIDAZOLE 500 MG PO TABS: 500.0000 mg | ORAL_TABLET | Freq: Two times a day (BID) | ORAL | Status: DC

## 2012-03-01 MED ORDER — SODIUM CHLORIDE 0.9 % IV BOLUS (SEPSIS)
500.0000 mL | Freq: Once | INTRAVENOUS | Status: AC
  Administered 2012-03-01: 500 mL via INTRAVENOUS

## 2012-03-01 MED ORDER — SODIUM CHLORIDE 0.9 % IV BOLUS (SEPSIS)
500.0000 mL | Freq: Once | INTRAVENOUS | Status: AC
  Administered 2012-03-01: 13:00:00 via INTRAVENOUS

## 2012-03-01 MED ORDER — MORPHINE SULFATE 4 MG/ML IJ SOLN
4.0000 mg | Freq: Once | INTRAMUSCULAR | Status: AC
  Administered 2012-03-01: 4 mg via INTRAVENOUS
  Filled 2012-03-01: qty 1

## 2012-03-01 MED ORDER — SODIUM CHLORIDE 0.9 % IV SOLN: INTRAVENOUS | Status: DC

## 2012-03-01 MED ORDER — IOHEXOL 300 MG/ML  SOLN
100.0000 mL | Freq: Once | INTRAMUSCULAR | Status: AC | PRN
  Administered 2012-03-01: 100 mL via INTRAVENOUS

## 2012-03-01 MED ORDER — METRONIDAZOLE 500 MG PO TABS
500.0000 mg | ORAL_TABLET | Freq: Once | ORAL | Status: AC
  Administered 2012-03-01: 500 mg via ORAL
  Filled 2012-03-01: qty 1

## 2012-03-01 MED ORDER — HYDROCODONE-ACETAMINOPHEN 5-325 MG PO TABS: ORAL_TABLET | ORAL | Status: DC

## 2012-03-01 MED ORDER — CIPROFLOXACIN HCL 500 MG PO TABS
500.0000 mg | ORAL_TABLET | Freq: Once | ORAL | Status: AC
  Administered 2012-03-01: 500 mg via ORAL
  Filled 2012-03-01: qty 1

## 2012-03-01 MED ORDER — IOHEXOL 300 MG/ML  SOLN: 50.0000 mL | Freq: Once | INTRAMUSCULAR | Status: DC | PRN

## 2012-03-01 MED ORDER — ONDANSETRON HCL 4 MG/2ML IJ SOLN
4.0000 mg | Freq: Once | INTRAMUSCULAR | Status: AC
  Administered 2012-03-01: 4 mg via INTRAVENOUS
  Filled 2012-03-01: qty 2

## 2012-03-01 NOTE — ED Notes (Signed)
Per EMS, pt reports increased ShOB with anxiety since being discharged earlier today.  Pt in more distress when supine.  Pt's son reports fluid was found around her lungs today. Pt reports pain to bilateral sides when she takes a deep breath.

## 2012-03-01 NOTE — ED Provider Notes (Signed)
History     CSN: [id number]  Arrival date & time 03/01/12  6295   First MD Initiated Contact with Patient 03/01/12 [phone]      Chief Complaint  Patient presents with  . Abdominal Pain    (Consider location/radiation/quality/duration/timing/severity/associated sxs/prior treatment) HPI Comments: Patient with history of abdominal pain, DM, cholelithiasis, no abdominal surgeries other than a C-section -- presents with complaint of generalized abdominal pain that is been worsening for the past 3 days. Onset was gradual. Pain is described as sharp. It is worse with palpation and movement. Patient states that she had similar pain 3 years ago. She has had multiple tests where "they put me to sleep" but no diagnosis. She's had nausea but no vomiting. She's not had any diarrhea. Stool has been black in color. Patient took ibuprofen yesterday without relief. No dysuria or urinary problems.  The history is provided by the patient and medical records.    Past Medical History  Diagnosis Date  . Hyperlipidemia   . Hypertension   . Diabetes mellitus   . Anemia   . Other specified disorder of rectum and anus     History reviewed. No pertinent past surgical history.  No family history on file.  History  Substance Use Topics  . Smoking status: Never Smoker   . Smokeless tobacco: Never Used  . Alcohol Use: No    OB History   Grav Para Term Preterm Abortions TAB SAB Ect Mult Living                  Review of Systems  Constitutional: Negative for fever.  HENT: Negative for sore throat and rhinorrhea.   Eyes: Negative for redness.  Respiratory: Negative for cough.   Cardiovascular: Negative for chest pain.  Gastrointestinal: Positive for nausea and abdominal pain. Negative for vomiting and diarrhea.  Genitourinary: Negative for dysuria.  Musculoskeletal: Negative for myalgias.  Skin: Negative for rash.  Neurological: Negative for headaches.    Allergies  Review of patient's  allergies indicates no known allergies.  Home Medications   Current Outpatient Rx  Name  Route  Sig  Dispense  Refill  . aspirin 81 MG tablet   Oral   Take 81 mg by mouth daily.         . diclofenac (VOLTAREN) 75 MG EC tablet   Oral   Take 1 tablet (75 mg total) by mouth 2 (two) times daily.   60 tablet   11   . diclofenac sodium (VOLTAREN) 1 % GEL   Topical   Apply 1 application topically 4 (four) times daily.   1 Tube   5   . FREESTYLE LITE test strip      USE AS DIRECTED   100 each   prn   . insulin glargine (LANTUS SOLOSTAR) 100 UNIT/ML injection   Subcutaneous   Inject 10 Units into the skin at bedtime.   5 pen   5   . losartan-hydrochlorothiazide (HYZAAR) 50-12.5 MG per tablet   Oral   Take 1 tablet by mouth daily.   90 tablet   3   . meloxicam (MOBIC) 15 MG tablet      TAKE ONE TABLET BY MOUTH EVERY DAY   30 tablet   5   . mesalamine (CANASA) 1000 MG suppository   Rectal   Place 1,000 mg rectally at bedtime.         . metFORMIN (GLUCOPHAGE) 500 MG tablet   Oral   Take 1 tablet (  500 mg total) by mouth 2 (two) times daily with a meal.   180 tablet   1   . pravastatin (PRAVACHOL) 40 MG tablet   Oral   Take 1 tablet (40 mg total) by mouth daily.   90 tablet   3     BP 117/59  Pulse 107  Temp(Src) 98 F (36.7 C) (Oral)  SpO2 96%  Physical Exam  Nursing note and vitals reviewed. Constitutional: She appears well-developed and well-nourished.  HENT:  Head: Normocephalic and atraumatic.  Eyes: Conjunctivae are normal. Right eye exhibits no discharge. Left eye exhibits no discharge.  Neck: Normal range of motion. Neck supple.  Cardiovascular: Normal rate, regular rhythm and normal heart sounds.   Pulmonary/Chest: Effort normal and breath sounds normal.  Abdominal: Soft. Bowel sounds are normal. There is generalized tenderness. There is no rigidity, no rebound, no guarding, no CVA tenderness, no tenderness at McBurney's point and negative  Murphy's sign.  Genitourinary: Rectal exam shows no external hemorrhoid, no internal hemorrhoid, no mass, no tenderness and anal tone normal.  Neurological: She is alert.  Skin: Skin is warm and dry.  Psychiatric: She has a normal mood and affect.    ED Course  Procedures (including critical care time)  Labs Reviewed  URINALYSIS, ROUTINE W REFLEX MICROSCOPIC - Abnormal; Notable for the following:    APPearance CLOUDY (*)    Glucose, UA 500 (*)    Hgb urine dipstick TRACE (*)    Ketones, ur TRACE (*)    All other components within normal limits  CBC WITH DIFFERENTIAL - Abnormal; Notable for the following:    WBC 13.1 (*)    RBC 2.94 (*)    Hemoglobin 10.3 (*)    HCT 29.7 (*)    MCV 101.0 (*)    MCH 35.0 (*)    RDW 19.6 (*)    Platelets 649 (*)    Neutrophils Relative 85 (*)    Lymphocytes Relative 9 (*)    Neutro Abs 11.1 (*)    All other components within normal limits  COMPREHENSIVE METABOLIC PANEL - Abnormal; Notable for the following:    Sodium 130 (*)    Chloride 94 (*)    Glucose, Bld 363 (*)    BUN 25 (*)    Creatinine, Ser 1.36 (*)    Total Bilirubin 1.3 (*)    GFR calc non Af Amer 40 (*)    GFR calc Af Amer 46 (*)    All other components within normal limits  URINE MICROSCOPIC-ADD ON - Abnormal; Notable for the following:    Bacteria, UA FEW (*)    Casts GRANULAR CAST (*)    All other components within normal limits  URINE CULTURE  LIPASE, BLOOD  LACTIC ACID, PLASMA  OCCULT BLOOD X 1 CARD TO LAB, STOOL  OCCULT BLOOD, POC DEVICE   Ct Abdomen Pelvis W Contrast  03/01/2012  *RADIOLOGY REPORT*  Clinical Data: , history hypertension, diabetes, hyperlipidemia  CT ABDOMEN AND PELVIS WITH CONTRAST  Technique:  Multidetector CT imaging of the abdomen and pelvis was performed following the standard protocol during bolus administration of intravenous contrast. Sagittal and coronal MPR images reconstructed from axial data set.  Contrast: OMNIPAQUE IOHEXOL 300  MG/ML  SOLN Dilute oral contrast.  Comparison: 04/01/2009  Findings: Bilateral pleural effusions and basilar atelectasis greater on the right. Minimal pericardial effusion. Distended gallbladder with dependent calculi. No definite gallbladder wall thickening or pericholecystic edema by CT. Stable right adrenal nodule 16 x  11 mm image 19. Tiny left renal cysts.  Remainder of liver, spleen, kidneys, and adrenal glands normal appearance. Pancreas enhances normally without discrete mass or calcification. Small umbilical hernia containing fat. Normal appendix. Right inguinal hernia containing fat. Unremarkable bladder, uterus and adnexae.  Extensive edema is seen throughout the retroperitoneum extending into the pelvis and into presacral space. Minimal ascites. No mass, adenopathy or free air. Stomach and bowel loops grossly normal appearance. No acute osseous findings.  IMPRESSION: Bibasilar effusions and atelectasis greater on right. Minimal pericardial effusion. Stable right adrenal nodule. Cholelithiasis without definite evidence of cholecystitis by CT, which is insensitive. Right inguinal and umbilical hernias and containing fat. Extensive edema throughout the retroperitoneum beginning inferior to pancreas extending into the presacral space; this is nonspecific and could be seen with nonspecific infection, pancreatitis, urinary tract infection. No GI source is identified. The lack of distention of the IVC and hepatic veins makes failure less likely.  No definite subcutaneous or additional soft tissue edema seen to suggest this is slowly related to hypoproteinemia. Correlation with urinalysis and serum amylase recommended. If there is clinical suspicion of acute cholecystitis, recommend ultrasound assessment.  Findings called to Renne Crigler on 03/01/2012 at 1418 hours.   Original Report Authenticated By: Ulyses Southward, M.D.      1. Abdominal pain   2. Nausea   3. Pleural effusion   4. Hyperglycemia   5.  Dehydration     10:03 AM Patient seen and examined. Work-up initiated. Medications ordered.   Vital signs reviewed and are as follows: Filed Vitals:   03/01/12 0949  BP: 117/59  Pulse: 107  Temp: 98 F (36.7 C)    Date: 03/01/2012  Rate: 100  Rhythm: normal sinus rhythm  QRS Axis: normal  Intervals: normal  ST/T Wave abnormalities: ST depressions inferiorly mild  Conduction Disutrbances:none  Narrative Interpretation:   Old EKG Reviewed: none available  10:26 AM EKG d/w Dr. Juleen China. Patient is having bilateral abd pain. Do not suspect ACS.    12:49 PM Elevated blood sugar, normal anion gap. Dehydration. Elevated WBC. D/w Dr. Juleen China who will see. CT ordered to r/o infection. LLQ tenderness on re-exam. Additional pain medication ordered.  3:23 PM CT findings discussed with radiologist and Dr. Juleen China. Will treat empirically and have patient follow-up closely with PCP. She appears well here. She has tolerated PO's without vomiting. Pain is controlled. Patient has received 2L NS, no SOB. Feel she is appropriate for outpatient therapy. PCP is [place].   Re-exam: minimal generalized abd tenderness  The patient was urged to return to the Emergency Department immediately with worsening of current symptoms, worsening abdominal pain, persistent vomiting, blood noted in stools, fever, or any other concerns. The patient verbalized understanding.   Patient counseled on use of narcotic pain medications at lowest dose. Counseled not to combine these medications with others containing tylenol. Urged not to drink alcohol, drive, or perform any other activities that requires focus while taking these medications. The patient verbalizes understanding and agrees with the plan.  3:41 PM Discussed findings, plan, and return considerations with son prior to discharged. Reiterated pain medication use under direct supervision and to not take metformin for 2 days.     MDM  Abd pain: Tolerating PO's,  non-specific edema on CT. No evidence of pancreatitis, significant UTI, other definite cause. Will treat empirically with cipro/flagyl. First dose given and patient tolerating well. Will need PCP f/u to ensure improvement. Strict return instructions given.   Pleural effusion: new, unclear  etiology. No PNA suspected. No SOB, CP. Will need PCP f/u.   Hyperglycemia: no evidence of ketosis/acidosis. No anion gap. Continue home meds. Patient knows to hold metformin x 2 days.         Renne Crigler, [place] 03/01/12 1544

## 2012-03-01 NOTE — ED Provider Notes (Signed)
History     CSN: [id number]  Arrival date & time 03/01/12  2211   First MD Initiated Contact with Patient 03/01/12 2309      Chief Complaint  Patient presents with  . Shortness of Breath    (Consider location/radiation/quality/duration/timing/severity/associated sxs/prior treatment) HPI Comments: Patient returns to the ER for repeat evaluation of abdominal pain. Patient was seen earlier today with diffuse abdominal pain and had a CAT scan which was nonspecific. She was discharged and since discharge has had increasing pain and now experiencing shortness of breath. Son does admit that he was unable to get pain medication that was prescribed earlier. Patient now indicating that she has a heaviness and pain in the Center of her chest which worsens when she takes a deep breath. Pain is radiating around both of her sides as well.  Patient is a 67 y.o. female presenting with shortness of breath.  Shortness of Breath Associated symptoms: abdominal pain and chest pain   Associated symptoms: no fever     Past Medical History  Diagnosis Date  . Hyperlipidemia   . Hypertension   . Diabetes mellitus   . Anemia   . Other specified disorder of rectum and anus     No past surgical history on file.  No family history on file.  History  Substance Use Topics  . Smoking status: Never Smoker   . Smokeless tobacco: Never Used  . Alcohol Use: No    OB History   Grav Para Term Preterm Abortions TAB SAB Ect Mult Living                  Review of Systems  Constitutional: Negative for fever.  Respiratory: Positive for shortness of breath.   Cardiovascular: Positive for chest pain.  Gastrointestinal: Positive for abdominal pain.  All other systems reviewed and are negative.    Allergies  Review of patient's allergies indicates no known allergies.  Home Medications   Current Outpatient Rx  Name  Route  Sig  Dispense  Refill  . aspirin 81 MG tablet   Oral   Take 81 mg by mouth  daily.         . ciprofloxacin (CIPRO) 500 MG tablet   Oral   Take 1 tablet (500 mg total) by mouth 2 (two) times daily.   20 tablet   0   . diclofenac (VOLTAREN) 75 MG EC tablet   Oral   Take 1 tablet (75 mg total) by mouth 2 (two) times daily.   60 tablet   11   . diclofenac sodium (VOLTAREN) 1 % GEL   Topical   Apply 1 application topically 4 (four) times daily.   1 Tube   5   . FREESTYLE LITE test strip      USE AS DIRECTED   100 each   prn   . HYDROcodone-acetaminophen (NORCO/VICODIN) 5-325 MG per tablet      Take one-half to one tablets PO every 6 hours as needed for severe pain   10 tablet   0   . ibuprofen (ADVIL,MOTRIN) 200 MG tablet   Oral   Take 200 mg by mouth every 6 (six) hours as needed for pain.         Marland Kitchen insulin glargine (LANTUS) 100 UNIT/ML injection   Subcutaneous   Inject 10 Units into the skin every morning.         Marland Kitchen losartan-hydrochlorothiazide (HYZAAR) 50-12.5 MG per tablet   Oral   Take 1  tablet by mouth daily.   90 tablet   3   . meloxicam (MOBIC) 15 MG tablet      TAKE ONE TABLET BY MOUTH EVERY DAY   30 tablet   5   . metFORMIN (GLUCOPHAGE) 500 MG tablet   Oral   Take 1 tablet (500 mg total) by mouth 2 (two) times daily with a meal.   180 tablet   1   . metroNIDAZOLE (FLAGYL) 500 MG tablet   Oral   Take 1 tablet (500 mg total) by mouth 2 (two) times daily.   20 tablet   0   . ondansetron (ZOFRAN ODT) 8 MG disintegrating tablet   Oral   Take 1 tablet (8 mg total) by mouth every 8 (eight) hours as needed for nausea.   6 tablet   0   . pravastatin (PRAVACHOL) 40 MG tablet   Oral   Take 1 tablet (40 mg total) by mouth daily.   90 tablet   3   . Pseudoeph-Doxylamine-DM-APAP (NYQUIL) 60-7.06-15-998 MG/30ML LIQD   Oral   Take 30 mLs by mouth every 6 (six) hours as needed (cough).           BP 137/67  Pulse 111  Temp(Src) 99.1 F (37.3 C) (Oral)  Resp 16  SpO2 96%  Physical Exam  Constitutional: She  is oriented to person, place, and time. She appears well-developed and well-nourished. No distress.  HENT:  Head: Normocephalic and atraumatic.  Right Ear: Hearing normal.  Nose: Nose normal.  Mouth/Throat: Oropharynx is clear and moist and mucous membranes are normal.  Eyes: Conjunctivae and EOM are normal. Pupils are equal, round, and reactive to light.  Neck: Normal range of motion. Neck supple.  Cardiovascular: Normal rate, regular rhythm, S1 normal and S2 normal.  Exam reveals no gallop and no friction rub.   No murmur heard. Pulmonary/Chest: Effort normal and breath sounds normal. No respiratory distress. She exhibits no tenderness.  Abdominal: Soft. Normal appearance and bowel sounds are normal. There is no hepatosplenomegaly. There is tenderness. There is no rebound, no guarding, no tenderness at McBurney's point and negative Murphy's sign. No hernia.  Musculoskeletal: Normal range of motion.  Neurological: She is alert and oriented to person, place, and time. She has normal strength. No cranial nerve deficit or sensory deficit. Coordination normal. GCS eye subscore is 4. GCS verbal subscore is 5. GCS motor subscore is 6.  Skin: Skin is warm, dry and intact. No rash noted. No cyanosis.  Psychiatric: She has a normal mood and affect. Her speech is normal and behavior is normal. Thought content normal.    ED Course  Procedures (including critical care time)   Date: 03/01/2012  Rate: 107  Rhythm: sinus tachycardia  QRS Axis: normal  Intervals: normal  ST/T Wave abnormalities: normal  Conduction Disutrbances:none  Narrative Interpretation:   Old EKG Reviewed: unchanged    Labs Reviewed  CBC WITH DIFFERENTIAL - Abnormal; Notable for the following:    WBC 14.4 (*)    RBC 2.56 (*)    Hemoglobin 9.5 (*)    HCT 26.0 (*)    MCV 101.6 (*)    MCH 37.1 (*)    MCHC 36.5 (*)    RDW 17.0 (*)    Platelets 560 (*)    Neutro Abs 10.9 (*)    Monocytes Absolute 1.4 (*)    All other  components within normal limits  COMPREHENSIVE METABOLIC PANEL - Abnormal; Notable for the following:  Sodium 129 (*)    Glucose, Bld 240 (*)    Albumin 3.1 (*)    GFR calc non Af Amer 53 (*)    GFR calc Af Amer 61 (*)    All other components within normal limits  BLOOD GAS, ARTERIAL - Abnormal; Notable for the following:    pO2, Arterial 65.9 (*)    Acid-base deficit 2.2 (*)    All other components within normal limits  D-DIMER, QUANTITATIVE - Abnormal; Notable for the following:    D-Dimer, Quant 1.58 (*)    All other components within normal limits  CULTURE, BLOOD (ROUTINE X 2)  CULTURE, BLOOD (ROUTINE X 2)  PRO B NATRIURETIC PEPTIDE  TROPONIN I   Dg Chest 2 View  03/01/2012  *RADIOLOGY REPORT*  Clinical Data: Shortness of breath.  CHEST - 2 VIEW  Comparison: None.  Findings: Moderate bilateral pleural effusions are noted. Underlying pneumonia or atelectasis cannot be excluded. Cardiomediastinal silhouette appears normal.  Impression:  Moderate bilateral pleural effusions with possible underlying pneumonia or atelectasis.   Original Report Authenticated By: Lupita Raider.,  M.D.    US Abdomen Complete  03/02/2012  *RADIOLOGY REPORT*  Clinical Data:  Abdominal pain.  ABDOMINAL ULTRASOUND COMPLETE  Comparison:  CT of the abdomen and pelvis performed 03/01/2012, and abdominal ultrasound performed 07/18/2008  Findings:  Gallbladder:  Multiple small stones are seen layering dependently within the gallbladder.  These are mobile in nature.  No gallbladder wall thickening or pericholecystic fluid is seen.  No ultrasonographic Murphy's sign is elicited.  Common Bile Duct:  0.7 cm in diameter; borderline normal for the patient's age.  Liver:  Normal parenchymal echogenicity and echotexture; no focal lesions identified.  Limited Doppler evaluation demonstrates normal blood flow within the liver.  IVC:  Unremarkable in appearance.  Pancreas:  Although the pancreas is difficult to visualize in its  entirety due to overlying bowel gas, no focal pancreatic abnormality is identified.  Spleen:  9.6 cm in length; within normal limits in size and echotexture.  Right kidney:  10.8 cm in length; normal in size, configuration and parenchymal echogenicity.  No evidence of mass or hydronephrosis.  Left kidney:  13.0 cm in length; normal in size, configuration and parenchymal echogenicity.  No evidence of mass or hydronephrosis. Not well characterized due to overlying bowel gas.  Abdominal Aorta:  Normal in caliber; no aneurysm identified.  Small bilateral pleural effusions are seen, right greater than left.  IMPRESSION:  1.  No acute abnormality seen within the abdomen. 2.  Cholelithiasis again noted; gallbladder otherwise unremarkable in appearance.   Original Report Authenticated By: Tonia Ghent, M.D.    Ct Abdomen Pelvis W Contrast  03/01/2012  *RADIOLOGY REPORT*  Clinical Data: , history hypertension, diabetes, hyperlipidemia  CT ABDOMEN AND PELVIS WITH CONTRAST  Technique:  Multidetector CT imaging of the abdomen and pelvis was performed following the standard protocol during bolus administration of intravenous contrast. Sagittal and coronal MPR images reconstructed from axial data set.  Contrast: OMNIPAQUE IOHEXOL 300 MG/ML  SOLN Dilute oral contrast.  Comparison: 04/01/2009  Findings: Bilateral pleural effusions and basilar atelectasis greater on the right. Minimal pericardial effusion. Distended gallbladder with dependent calculi. No definite gallbladder wall thickening or pericholecystic edema by CT. Stable right adrenal nodule 16 x 11 mm image 19. Tiny left renal cysts.  Remainder of liver, spleen, kidneys, and adrenal glands normal appearance. Pancreas enhances normally without discrete mass or calcification. Small umbilical hernia containing fat. Normal appendix. Right inguinal  hernia containing fat. Unremarkable bladder, uterus and adnexae.  Extensive edema is seen throughout the retroperitoneum  extending into the pelvis and into presacral space. Minimal ascites. No mass, adenopathy or free air. Stomach and bowel loops grossly normal appearance. No acute osseous findings.  IMPRESSION: Bibasilar effusions and atelectasis greater on right. Minimal pericardial effusion. Stable right adrenal nodule. Cholelithiasis without definite evidence of cholecystitis by CT, which is insensitive. Right inguinal and umbilical hernias and containing fat. Extensive edema throughout the retroperitoneum beginning inferior to pancreas extending into the presacral space; this is nonspecific and could be seen with nonspecific infection, pancreatitis, urinary tract infection. No GI source is identified. The lack of distention of the IVC and hepatic veins makes failure less likely.  No definite subcutaneous or additional soft tissue edema seen to suggest this is slowly related to hypoproteinemia. Correlation with urinalysis and serum amylase recommended. If there is clinical suspicion of acute cholecystitis, recommend ultrasound assessment.  Findings called to Renne Crigler on 03/01/2012 at 1418 hours.   Original Report Authenticated By: Ulyses Southward, M.D.      Diagnosis: 1. Bilateral pleural effusion, possible community-acquired pneumonia 2. Hypoxia 3. Abdominal pain    MDM  Patient presents with worsening abdominal pain. She was seen earlier today and had a workup including a CAT scan. CAT scan did not show any abnormalities that explain the symptoms. She has not taken any other pain medication at home, but patient reports that her pain has worsened. She is also now much more short of breath. Patient indicates that she has some pain across the top of her chest that worsens when she takes a deep breath. She did appear mildly tachypneic and uncomfortable at arrival. Review of the records revealed that she did have bilateral pleural effusions on the CAT scan. Review of records also reveals a previous diagnosis of  cholelithiasis. The CAT scan did not show any evidence of cholecystitis, is not the best test for this and I cannot ruled out. Patient was therefore sent for ultrasound. Ultrasound confirmed stones without any evidence of cholecystitis. Patient had a chest x-ray showed moderate bilateral effusions, cannot rule out pneumonia within the effusions. Patient initiated Levaquin empirically. Cause of the effusions is unclear at this time. Atraumatic peptide was less than 100, no peripheral edema. Doubt congestive heart failure. Reviewing the CAT scan also showed a small pericardial effusion. Blood gases show hypoxia without CO2 retention. Supplemental oxygen by nasal cannula has improved the patient's oxygen saturation and she is more comfortable.  Patient does report pleuritic chest discomfort. PE is considered, but difficult to evaluate at this time. Patient shows mild renal insufficiency and had a bolus of IV contrast at the previous visit. I am concerned a second dose could cause significantly worse renal injury. I am also concerned that a VQ scan would not be of great benefit because of the effusions and possible pneumonia. D-dimer was elevated at 1.58. Would consider repeating CAT scan after IV hydration. Patient will be admitted to the hospitalist program.        Gilda Crease, MD 03/02/12 [phone]

## 2012-03-01 NOTE — ED Notes (Signed)
[id number]> Expected date:<BR> Expected time:<BR> Means of arrival:<BR> Comments:<BR> EMS [facility]

## 2012-03-01 NOTE — ED Notes (Signed)
2 days abd pain chronic for 2 yrs now and seen md ram and they have not dx her with anything. Nausea with sitting up, no emesis. Iv 20g iv rt ac, alert  x4

## 2012-03-02 ENCOUNTER — Inpatient Hospital Stay (HOSPITAL_COMMUNITY): Payer: Medicare Other

## 2012-03-02 ENCOUNTER — Encounter (HOSPITAL_COMMUNITY): Payer: Self-pay | Admitting: Emergency Medicine

## 2012-03-02 DIAGNOSIS — D539 Nutritional anemia, unspecified: Secondary | ICD-10-CM

## 2012-03-02 DIAGNOSIS — E1349 Other specified diabetes mellitus with other diabetic neurological complication: Secondary | ICD-10-CM

## 2012-03-02 DIAGNOSIS — E1142 Type 2 diabetes mellitus with diabetic polyneuropathy: Secondary | ICD-10-CM

## 2012-03-02 DIAGNOSIS — E871 Hypo-osmolality and hyponatremia: Secondary | ICD-10-CM

## 2012-03-02 DIAGNOSIS — J90 Pleural effusion, not elsewhere classified: Secondary | ICD-10-CM | POA: Diagnosis present

## 2012-03-02 DIAGNOSIS — R109 Unspecified abdominal pain: Secondary | ICD-10-CM | POA: Diagnosis present

## 2012-03-02 DIAGNOSIS — I319 Disease of pericardium, unspecified: Secondary | ICD-10-CM

## 2012-03-02 DIAGNOSIS — N179 Acute kidney failure, unspecified: Secondary | ICD-10-CM

## 2012-03-02 DIAGNOSIS — R0602 Shortness of breath: Secondary | ICD-10-CM | POA: Diagnosis present

## 2012-03-02 DIAGNOSIS — D473 Essential (hemorrhagic) thrombocythemia: Secondary | ICD-10-CM

## 2012-03-02 DIAGNOSIS — D72829 Elevated white blood cell count, unspecified: Secondary | ICD-10-CM

## 2012-03-02 LAB — COMPREHENSIVE METABOLIC PANEL
ALT: 10 U/L (ref 0–35)
Albumin: 3.1 g/dL — ABNORMAL LOW (ref 3.5–5.2)
Alkaline Phosphatase: 62 U/L (ref 39–117)
Alkaline Phosphatase: 56 U/L (ref 39–117)
BUN: 20 mg/dL (ref 6–23)
BUN: 18 mg/dL (ref 6–23)
CO2: 21 mEq/L (ref 19–32)
Calcium: 8.6 mg/dL (ref 8.4–10.5)
Creatinine, Ser: 0.97 mg/dL (ref 0.50–1.10)
GFR calc Af Amer: 61 mL/min — ABNORMAL LOW (ref >90)
GFR calc Af Amer: 69 mL/min — ABNORMAL LOW (ref >90)
GFR calc non Af Amer: 53 mL/min — ABNORMAL LOW (ref >90)
Glucose, Bld: 240 mg/dL — ABNORMAL HIGH (ref 70–99)
Glucose, Bld: 259 mg/dL — ABNORMAL HIGH (ref 70–99)
Potassium: 4.3 mEq/L (ref 3.5–5.1)
Sodium: 129 mEq/L — ABNORMAL LOW (ref 135–145)
Total Bilirubin: 1.1 mg/dL (ref 0.3–1.2)
Total Protein: 5.6 g/dL — ABNORMAL LOW (ref 6.0–8.3)

## 2012-03-02 LAB — CBC WITH DIFFERENTIAL/PLATELET
Basophils Absolute: 0 10*3/uL (ref 0.0–0.1)
Basophils Absolute: 0 10*3/uL (ref 0.0–0.1)
Basophils Relative: 0 % (ref 0–1)
Eosinophils Absolute: 0.1 10*3/uL (ref 0.0–0.7)
Eosinophils Relative: 1 % (ref 0–5)
Hemoglobin: 9.5 g/dL — ABNORMAL LOW (ref 12.0–15.0)
Lymphocytes Relative: 14 % (ref 12–46)
Lymphs Abs: 1.6 10*3/uL (ref 0.7–4.0)
MCH: 37.1 pg — ABNORMAL HIGH (ref 26.0–34.0)
MCHC: 36.5 g/dL — ABNORMAL HIGH (ref 30.0–36.0)
MCV: 100.8 fL — ABNORMAL HIGH (ref 78.0–100.0)
Monocytes Absolute: 1.4 10*3/uL — ABNORMAL HIGH (ref 0.1–1.0)
Neutro Abs: 8.7 10*3/uL — ABNORMAL HIGH (ref 1.7–7.7)
Neutrophils Relative %: 75 % (ref 43–77)
Platelets: 560 10*3/uL — ABNORMAL HIGH (ref 150–400)
Platelets: 515 10*3/uL — ABNORMAL HIGH (ref 150–400)
RBC: 2.45 MIL/uL — ABNORMAL LOW (ref 3.87–5.11)
RDW: 17 % — ABNORMAL HIGH (ref 11.5–15.5)
RDW: 17.5 % — ABNORMAL HIGH (ref 11.5–15.5)
WBC Morphology: INCREASED
WBC: 11.6 10*3/uL — ABNORMAL HIGH (ref 4.0–10.5)

## 2012-03-02 LAB — GLUCOSE, CAPILLARY
Glucose-Capillary: 243 mg/dL — ABNORMAL HIGH (ref 70–99)
Glucose-Capillary: 207 mg/dL — ABNORMAL HIGH (ref 70–99)
Glucose-Capillary: 132 mg/dL — ABNORMAL HIGH (ref 70–99)

## 2012-03-02 LAB — URINE CULTURE
Colony Count: NO GROWTH
Culture: NO GROWTH

## 2012-03-02 LAB — BLOOD GAS, ARTERIAL
Bicarbonate: 22.3 mEq/L (ref 20.0–24.0)
Patient temperature: 98.6
TCO2: 21.1 mmol/L (ref 0–100)
pCO2 arterial: 39.5 mmHg (ref 35.0–45.0)
pH, Arterial: 7.371 (ref 7.350–7.450)

## 2012-03-02 LAB — D-DIMER, QUANTITATIVE: D-Dimer, Quant: 1.58 ug/mL-FEU — ABNORMAL HIGH (ref 0.00–0.48)

## 2012-03-02 LAB — LIPASE, BLOOD: Lipase: 22 U/L (ref 11–59)

## 2012-03-02 MED ORDER — LOSARTAN POTASSIUM 50 MG PO TABS
50.0000 mg | ORAL_TABLET | Freq: Every day | ORAL | Status: DC
  Administered 2012-03-02: 50 mg via ORAL
  Filled 2012-03-02: qty 1

## 2012-03-02 MED ORDER — ONDANSETRON HCL 4 MG/2ML IJ SOLN: 4.0000 mg | Freq: Four times a day (QID) | INTRAMUSCULAR | Status: DC | PRN

## 2012-03-02 MED ORDER — LOSARTAN POTASSIUM-HCTZ 50-12.5 MG PO TABS: 1.0000 | ORAL_TABLET | Freq: Every day | ORAL | Status: DC

## 2012-03-02 MED ORDER — PANTOPRAZOLE SODIUM 40 MG PO TBEC
40.0000 mg | DELAYED_RELEASE_TABLET | Freq: Every day | ORAL | Status: DC
  Administered 2012-03-03 – 2012-03-07 (×5): 40 mg via ORAL
  Filled 2012-03-02 (×5): qty 1

## 2012-03-02 MED ORDER — ACETAMINOPHEN 650 MG RE SUPP: 650.0000 mg | Freq: Four times a day (QID) | RECTAL | Status: DC | PRN

## 2012-03-02 MED ORDER — HYDROCODONE-ACETAMINOPHEN 5-325 MG PO TABS
1.0000 | ORAL_TABLET | Freq: Four times a day (QID) | ORAL | Status: DC | PRN
  Administered 2012-03-02 – 2012-03-06 (×8): 1 via ORAL
  Filled 2012-03-02 (×8): qty 1

## 2012-03-02 MED ORDER — SIMVASTATIN 20 MG PO TABS
20.0000 mg | ORAL_TABLET | Freq: Every day | ORAL | Status: DC
  Administered 2012-03-02 – 2012-03-07 (×6): 20 mg via ORAL
  Filled 2012-03-02 (×6): qty 1

## 2012-03-02 MED ORDER — INSULIN ASPART 100 UNIT/ML ~~LOC~~ SOLN
0.0000 [IU] | Freq: Three times a day (TID) | SUBCUTANEOUS | Status: DC
  Administered 2012-03-02: 3 [IU] via SUBCUTANEOUS
  Administered 2012-03-02: 2 [IU] via SUBCUTANEOUS
  Administered 2012-03-03: 7 [IU] via SUBCUTANEOUS
  Administered 2012-03-03: 2 [IU] via SUBCUTANEOUS
  Administered 2012-03-04: 5 [IU] via SUBCUTANEOUS
  Administered 2012-03-04: 2 [IU] via SUBCUTANEOUS
  Administered 2012-03-05: 3 [IU] via SUBCUTANEOUS
  Administered 2012-03-05: 2 [IU] via SUBCUTANEOUS
  Administered 2012-03-05: 3 [IU] via SUBCUTANEOUS

## 2012-03-02 MED ORDER — HYDROCHLOROTHIAZIDE 12.5 MG PO CAPS
12.5000 mg | ORAL_CAPSULE | Freq: Every day | ORAL | Status: DC
  Administered 2012-03-02: 12.5 mg via ORAL
  Filled 2012-03-02: qty 1

## 2012-03-02 MED ORDER — HEPARIN SODIUM (PORCINE) 5000 UNIT/ML IJ SOLN
5000.0000 [IU] | Freq: Three times a day (TID) | INTRAMUSCULAR | Status: DC
  Administered 2012-03-02: 5000 [IU] via SUBCUTANEOUS
  Filled 2012-03-02 (×3): qty 1

## 2012-03-02 MED ORDER — HYOSCYAMINE SULFATE 0.125 MG SL SUBL
0.1250 mg | SUBLINGUAL_TABLET | Freq: Four times a day (QID) | SUBLINGUAL | Status: DC
  Administered 2012-03-02 – 2012-03-07 (×18): 0.125 mg via SUBLINGUAL
  Filled 2012-03-02 (×24): qty 1

## 2012-03-02 MED ORDER — FUROSEMIDE 10 MG/ML IJ SOLN
20.0000 mg | Freq: Once | INTRAMUSCULAR | Status: AC
  Administered 2012-03-02: 20 mg via INTRAVENOUS
  Filled 2012-03-02: qty 2

## 2012-03-02 MED ORDER — SODIUM CHLORIDE 0.9 % IJ SOLN
3.0000 mL | Freq: Two times a day (BID) | INTRAMUSCULAR | Status: DC
  Administered 2012-03-02 – 2012-03-03 (×3): 3 mL via INTRAVENOUS
  Administered 2012-03-04: 22:00:00 via INTRAVENOUS
  Administered 2012-03-04: 3 mL via INTRAVENOUS
  Administered 2012-03-05: 22:00:00 via INTRAVENOUS
  Administered 2012-03-05 – 2012-03-07 (×3): 3 mL via INTRAVENOUS

## 2012-03-02 MED ORDER — IOHEXOL 350 MG/ML SOLN
80.0000 mL | Freq: Once | INTRAVENOUS | Status: AC | PRN
  Administered 2012-03-02: 80 mL via INTRAVENOUS

## 2012-03-02 MED ORDER — FAMOTIDINE 20 MG PO TABS
20.0000 mg | ORAL_TABLET | Freq: Every day | ORAL | Status: DC
  Administered 2012-03-02 – 2012-03-06 (×5): 20 mg via ORAL
  Filled 2012-03-02 (×7): qty 1

## 2012-03-02 MED ORDER — LEVOFLOXACIN IN D5W 750 MG/150ML IV SOLN
750.0000 mg | INTRAVENOUS | Status: DC
  Administered 2012-03-03 – 2012-03-05 (×3): 750 mg via INTRAVENOUS
  Filled 2012-03-02 (×3): qty 150

## 2012-03-02 MED ORDER — FUROSEMIDE 10 MG/ML IJ SOLN
20.0000 mg | Freq: Every day | INTRAMUSCULAR | Status: DC
  Administered 2012-03-03: 20 mg via INTRAVENOUS
  Filled 2012-03-02: qty 2

## 2012-03-02 MED ORDER — SODIUM CHLORIDE 0.9 % IJ SOLN
3.0000 mL | Freq: Two times a day (BID) | INTRAMUSCULAR | Status: DC
  Administered 2012-03-02 – 2012-03-03 (×2): 3 mL via INTRAVENOUS
  Administered 2012-03-06: 23:00:00 via INTRAVENOUS

## 2012-03-02 MED ORDER — ONDANSETRON 8 MG PO TBDP
8.0000 mg | ORAL_TABLET | Freq: Three times a day (TID) | ORAL | Status: DC | PRN
  Filled 2012-03-02: qty 1

## 2012-03-02 MED ORDER — INSULIN GLARGINE 100 UNIT/ML ~~LOC~~ SOLN
10.0000 [IU] | Freq: Every morning | SUBCUTANEOUS | Status: DC
  Administered 2012-03-02 – 2012-03-05 (×4): 10 [IU] via SUBCUTANEOUS

## 2012-03-02 MED ORDER — ACETAMINOPHEN 325 MG PO TABS
650.0000 mg | ORAL_TABLET | Freq: Four times a day (QID) | ORAL | Status: DC | PRN
  Administered 2012-03-02: 650 mg via ORAL
  Filled 2012-03-02: qty 2

## 2012-03-02 MED ORDER — ONDANSETRON HCL 4 MG PO TABS: 4.0000 mg | ORAL_TABLET | Freq: Four times a day (QID) | ORAL | Status: DC | PRN

## 2012-03-02 MED ORDER — LEVOFLOXACIN IN D5W 750 MG/150ML IV SOLN
750.0000 mg | Freq: Once | INTRAVENOUS | Status: AC
  Administered 2012-03-02: 750 mg via INTRAVENOUS
  Filled 2012-03-02: qty 150

## 2012-03-02 NOTE — ED Notes (Addendum)
Spoke with EDP for second blood culture.  He stated to try again before giving abx.

## 2012-03-02 NOTE — Consult Note (Signed)
PULMONARY  / CRITICAL CARE MEDICINE  Name: Christine Silva MRN: [id number] DOB: [birth year]    ADMISSION DATE:  03/01/2012 CONSULTATION DATE: 2/14  REFERRING MD :  [facility]  CHIEF COMPLAINT: Pleural effusion  BRIEF PATIENT DESCRIPTION:  67 yo female who was admitted with abd pain 2-13. Notable for increased SOB(with any activity) and non productive cough, no fever , chills or sweats. Recent UTI is noted. CxR  and CT chest reveal large bilateral pleural effusion and PCCM consulted.  SIGNIFICANT EVENTS / STUDIES:    LINES / TUBES:   CULTURES: 2/14 bc x 2>>  ANTIBIOTICS: 2/14 levaquin>>  HISTORY OF PRESENT ILLNESS:   21 yobf retired Investment banker, operational with h/o smoking but no prior resp problems admit through ER AM 2/14 with abd pain and sob with minimal activity x 4 days assoc with nausea but no vomiting or diarrhea or fever. Min dry cough.  Very difficult historian not really clear when she was last well or how many different arthritis meds she takes, speaks [place] as her native language.  At baseline sleeping ok without nocturnal  or early am exacerbation  of respiratory  c/o's or need for noct saba. Also denies any obvious fluctuation of symptoms with weather or environmental changes or other aggravating or alleviating factors except as outlined above   ROS  The following are not active complaints unless bolded sore throat, dysphagia, dental problems, itching, sneezing,  nasal congestion or excess/ purulent secretions, ear ache,   fever, chills, sweats, unintended wt loss, pleuritic or exertional cp, hemoptysis,  orthopnea pnd or leg swelling, presyncope, palpitations, heartburn, abdominal pain, anorexia, nausea, vomiting, diarrhea  or change in bowel or urinary habits, change in stools or urine, dysuria,hematuria,  rash, arthralgias, visual complaints, headache, numbness weakness or ataxia or problems with walking or coordination,  change in mood/affect or memory.      PAST MEDICAL HISTORY :  Past  Medical History  Diagnosis Date  . Hyperlipidemia   . Hypertension   . Diabetes mellitus   . Anemia   . Other specified disorder of rectum and anus    Past Surgical History  Procedure Laterality Date  . Cesarean section     Prior to Admission medications   Medication Sig Start Date End Date Taking? Authorizing Provider  aspirin 81 MG tablet Take 81 mg by mouth daily.   Yes Historical Provider, MD  ciprofloxacin (CIPRO) 500 MG tablet Take 1 tablet (500 mg total) by mouth 2 (two) times daily. 03/01/12  Yes Renne Crigler, PA  diclofenac (VOLTAREN) 75 MG EC tablet Take 1 tablet (75 mg total) by mouth 2 (two) times daily. 04/29/11 04/28/12 Yes Ronnald Nian, MD  diclofenac sodium (VOLTAREN) 1 % GEL Apply 1 application topically 4 (four) times daily. 08/23/11  Yes Ronnald Nian, MD  HYDROcodone-acetaminophen (NORCO/VICODIN) 5-325 MG per tablet Take one-half to one tablets PO every 6 hours as needed for severe pain 03/01/12  Yes Renne Crigler, PA  ibuprofen (ADVIL,MOTRIN) 200 MG tablet Take 200 mg by mouth every 6 (six) hours as needed for pain.   Yes Historical Provider, MD  insulin glargine (LANTUS) 100 UNIT/ML injection Inject 10 Units into the skin every morning. 12/22/11  Yes Ronnald Nian, MD  losartan-hydrochlorothiazide [facility]) 50-12.5 MG per tablet Take 1 tablet by mouth daily. 04/29/11  Yes Ronnald Nian, MD  meloxicam (MOBIC) 15 MG tablet TAKE ONE TABLET BY MOUTH EVERY DAY 02/08/11  Yes Ronnald Nian, MD  metFORMIN (GLUCOPHAGE) 500 MG  tablet Take 1 tablet (500 mg total) by mouth 2 (two) times daily with a meal. 12/22/11  Yes Ronnald Nian, MD  metroNIDAZOLE (FLAGYL) 500 MG tablet Take 1 tablet (500 mg total) by mouth 2 (two) times daily. 03/01/12  Yes Renne Crigler, PA  pravastatin (PRAVACHOL) 40 MG tablet Take 1 tablet (40 mg total) by mouth daily. 04/29/11  Yes Ronnald Nian, MD  Pseudoeph-Doxylamine-DM-APAP (NYQUIL) 60-7.06-15-998 MG/30ML LIQD Take 30 mLs by mouth every 6 (six) hours as  needed (cough).   Yes Historical Provider, MD  ondansetron (ZOFRAN ODT) 8 MG disintegrating tablet Take 1 tablet (8 mg total) by mouth every 8 (eight) hours as needed for nausea. 03/01/12   Renne Crigler, PA   No Known Allergies  FAMILY HISTORY:  Family History  Problem Relation Age of Onset  . Diabetes Mellitus II Mother    SOCIAL HISTORY: She is a immigrant from [place]. 1/2 ppd tobacco age [age]. Retired Systems analyst. No toxic exposures.   SUBJECTIVE:  NAD at rest. VITAL SIGNS: Temp:  [98 F (36.7 C)-99.1 F (37.3 C)] 98.4 F (36.9 C) (02/14 1015) Pulse Rate:  [97-111] 110 (02/14 1015) Resp:  [16-26] 22 (02/14 1015) BP: (99-140)/(49-75) 140/63 mmHg (02/14 1015) SpO2:  [95 %-100 %] 96 % (02/14 1015) 02 rx =  2lpm  PHYSICAL EXAMINATION: General:  [id number]@rest  Neuro:  Intact HEENT:  No LAN, oral mucosa unremarkable. Neck:  No jvd Cardiovascular:  HSR RRR Lungs:  Decreased in bases. Dull to percussion bases Abdomen:  Tender without guarding, +bs Musculoskeletal:  intact Skin:  warm   Recent Labs Lab 03/01/12 1001 03/02/12 0110 03/02/12 0812  NA 130* 129* 127*  K 4.6 4.3 4.2  CL 94* 97 94*  CO2 22 21 24   BUN 25* 20 18  CREATININE 1.36* 1.07 0.97  GLUCOSE 363* 240* 259*    Recent Labs Lab 03/01/12 1001 03/02/12 0110 03/02/12 0812  HGB 10.3* 9.5* 8.6*  HCT 29.7* 26.0* 24.7*  WBC 13.1* 14.4* 11.6*  PLT 649* 560* 515*   Dg Chest 2 View  03/01/2012  *RADIOLOGY REPORT*  Clinical Data: Shortness of breath.  CHEST - 2 VIEW  Comparison: None.  Findings: Moderate bilateral pleural effusions are noted. Underlying pneumonia or atelectasis cannot be excluded. Cardiomediastinal silhouette appears normal.  Impression:  Moderate bilateral pleural effusions with possible underlying pneumonia or atelectasis.   Original Report Authenticated By: Lupita Raider.,  M.D.    Ct Angio Chest Pe W/cm &/or Wo Cm  03/02/2012  *RADIOLOGY REPORT*  Clinical Data: Shortness of breath.   CT ANGIOGRAPHY CHEST  Technique:  Multidetector CT imaging of the chest using the standard protocol during bolus administration of intravenous contrast. Multiplanar reconstructed images including MIPs were obtained and reviewed to evaluate the vascular anatomy.  Contrast: 80mL OMNIPAQUE IOHEXOL 350 MG/ML SOLN  Comparison: PA and lateral chest 03/01/2012.  CT chest 08/04/2005.  Findings: No pulmonary embolus is identified.  The patient has moderate to moderately large bilateral pleural effusions.  There is infiltration of subcutaneous fat along the anterior aspect of the chest wall centrally and on the left.  No focal fluid collection in the subcutaneous tissues is identified.  There is no axillary, hilar or mediastinal lymphadenopathy.  Cardiomegaly is noted. Lungs demonstrate bilateral compressive atelectasis.  There is also some airspace disease in the lingula with some air bronchograms identified.  Scattered ground-glass attenuation is noted. Incidentally imaged upper abdomen is unremarkable.  No focal bony abnormality is identified.  IMPRESSION:  1.  Negative for pulmonary embolus. 2.  Moderate to moderately large bilateral pleural effusions with associated compressive atelectasis.  There is also some airspace disease in the lingula which could be atelectasis or pneumonia. 3.  Infiltration of subcutaneous fat of the upper anterior chest wall centrally and on the left could be due to subcutaneous edema, contusion or cellulitis.   Original Report Authenticated By: Holley Dexter, M.D.    US Abdomen Complete  03/02/2012  *RADIOLOGY REPORT*  Clinical Data:  Abdominal pain.  ABDOMINAL ULTRASOUND COMPLETE  Comparison:  CT of the abdomen and pelvis performed 03/01/2012, and abdominal ultrasound performed 07/18/2008  Findings:  Gallbladder:  Multiple small stones are seen layering dependently within the gallbladder.  These are mobile in nature.  No gallbladder wall thickening or pericholecystic fluid is seen.  No  ultrasonographic Murphy's sign is elicited.  Common Bile Duct:  0.7 cm in diameter; borderline normal for the patient's age.  Liver:  Normal parenchymal echogenicity and echotexture; no focal lesions identified.  Limited Doppler evaluation demonstrates normal blood flow within the liver.  IVC:  Unremarkable in appearance.  Pancreas:  Although the pancreas is difficult to visualize in its entirety due to overlying bowel gas, no focal pancreatic abnormality is identified.  Spleen:  9.6 cm in length; within normal limits in size and echotexture.  Right kidney:  10.8 cm in length; normal in size, configuration and parenchymal echogenicity.  No evidence of mass or hydronephrosis.  Left kidney:  13.0 cm in length; normal in size, configuration and parenchymal echogenicity.  No evidence of mass or hydronephrosis. Not well characterized due to overlying bowel gas.  Abdominal Aorta:  Normal in caliber; no aneurysm identified.  Small bilateral pleural effusions are seen, right greater than left.  IMPRESSION:  1.  No acute abnormality seen within the abdomen. 2.  Cholelithiasis again noted; gallbladder otherwise unremarkable in appearance.   Original Report Authenticated By: Tonia Ghent, M.D.    Ct Abdomen Pelvis W Contrast  03/01/2012  *RADIOLOGY REPORT*  Clinical Data: , history hypertension, diabetes, hyperlipidemia  CT ABDOMEN AND PELVIS WITH CONTRAST  Technique:  Multidetector CT imaging of the abdomen and pelvis was performed following the standard protocol during bolus administration of intravenous contrast. Sagittal and coronal MPR images reconstructed from axial data set.  Contrast: OMNIPAQUE IOHEXOL 300 MG/ML  SOLN Dilute oral contrast.  Comparison: 04/01/2009  Findings: Bilateral pleural effusions and basilar atelectasis greater on the right. Minimal pericardial effusion. Distended gallbladder with dependent calculi. No definite gallbladder wall thickening or pericholecystic edema by CT. Stable right  adrenal nodule 16 x 11 mm image 19. Tiny left renal cysts.  Remainder of liver, spleen, kidneys, and adrenal glands normal appearance. Pancreas enhances normally without discrete mass or calcification. Small umbilical hernia containing fat. Normal appendix. Right inguinal hernia containing fat. Unremarkable bladder, uterus and adnexae.  Extensive edema is seen throughout the retroperitoneum extending into the pelvis and into presacral space. Minimal ascites. No mass, adenopathy or free air. Stomach and bowel loops grossly normal appearance. No acute osseous findings.  IMPRESSION: Bibasilar effusions and atelectasis greater on right. Minimal pericardial effusion. Stable right adrenal nodule. Cholelithiasis without definite evidence of cholecystitis by CT, which is insensitive. Right inguinal and umbilical hernias and containing fat. Extensive edema throughout the retroperitoneum beginning inferior to pancreas extending into the presacral space; this is nonspecific and could be seen with nonspecific infection, pancreatitis, urinary tract infection. No GI source is identified. The lack of distention of the IVC and  hepatic veins makes failure less likely.  No definite subcutaneous or additional soft tissue edema seen to suggest this is slowly related to hypoproteinemia. Correlation with urinalysis and serum amylase recommended. If there is clinical suspicion of acute cholecystitis, recommend ultrasound assessment.  Findings called to Renne Crigler on 03/01/2012 at 1418 hours.   Original Report Authenticated By: Ulyses Southward, M.D.     ASSESSMENT / PLAN: Bilateral pleural effusions ? etiology with 4 days of increasing dyspnea with  abd process (?infective). No lower ext edema. ProBNP   not indicative of CHF and with albumin nl that only leaves vol overload from excess use of nsaids/ renal insuff and hypothyroidism in the ddx   Rec Check TSH and ESR  Consider R thoracentesis 2/15 if not improving with conservative  rx with diuresis and off all nsaids as you plan plus stop the cozar for now as bp low   2) Anemia ? Acute blood loss > d/c hep/ rx ppi and recheck cbc 2/15   Sandrea Hughs, MD Pulmonary and Critical Care Medicine [facility] Cell [phone] After 5:30 PM or weekends, call [phone]

## 2012-03-02 NOTE — ED Notes (Signed)
Hospitalist talking to pt at this time .

## 2012-03-02 NOTE — Progress Notes (Signed)
   CARE MANAGEMENT NOTE 03/02/2012  Patient:  Christine Silva, Christine Silva   Account Number:  [id number]  Date Initiated:  03/02/2012  Documentation initiated by:  Jiles Crocker  Subjective/Objective Assessment:   ADMITTED WITH ABDOMINAL PAIN, SOB     Action/Plan:   PCP: Carollee Herter, MD  LIVES WITH SON; POSSIBLY NEED HHC AT DISCHARGE; CM FOLLOWING FOR DCP   Anticipated DC Date:  03/07/2012   Anticipated DC Plan:  HOME W HOME HEALTH SERVICES      DC Planning Services  CM consult         Status of service:  In process, will continue to follow Medicare Important Message given?  NA - LOS <3 / Initial given by admissions (If response is "NO", the following Medicare IM given date fields will be blank) Per UR Regulation:  Reviewed for med. necessity/level of care/duration of stay Comments:  03/02/2012- B Keva Darty RN,BSN,MHA

## 2012-03-02 NOTE — Progress Notes (Signed)
ANTIBIOTIC CONSULT NOTE - INITIAL  Pharmacy Consult for Levaquin Indication: PNA  No Known Allergies  Patient Measurements:    Vital Signs: Temp: 99.1 F (37.3 C) (02/13 2305) Temp src: Oral (02/13 2305) BP: 112/49 mmHg (02/14 0445) Pulse Rate: 108 (02/14 0445) Intake/Output from previous day:   Intake/Output from this shift:    Labs:  Recent Labs  03/01/12 1001 03/02/12 0110 03/02/12 0812  WBC 13.1* 14.4* 11.6*  HGB 10.3* 9.5* 8.6*  PLT 649* 560* 515*  CREATININE 1.36* 1.07 0.97   The CrCl is unknown because both a height and weight (above a minimum accepted value) are required for this calculation.    Microbiology: No results found for this or any previous visit (from the past 720 hour(s)).  Medical History: Past Medical History  Diagnosis Date  . Hyperlipidemia   . Hypertension   . Diabetes mellitus   . Anemia   . Other specified disorder of rectum and anus     Medications:  Anti-infectives   Start     Dose/Rate Route Frequency Ordered Stop   03/02/12 0100  levofloxacin (LEVAQUIN) IVPB 750 mg     750 mg 100 mL/hr over 90 Minutes Intravenous  Once 03/02/12 0049 03/02/12 0804     Assessment: 67 yo F presents 2/13 with CC: abd pain and SOB. Prior to admit, pt was taking cipro 500mg  BID and flagyl 500mg  BID. Pt has received cipro 500mg  PO x1 and flagyl 500mg  PO x1 on 2/13, and Levaquin 750mg  IV x1 so far today. Order now to continue Levaquin per Rx for possible PNA. Ht/wt not yet entered in Epic - will wait until this info is available.    Plan:  1) Await ht/wt info - further Levaquin doses to follow once that info is available.  Darrol Angel, PharmD Pager: [phone] 03/02/2012,10:36 AM

## 2012-03-02 NOTE — H&P (Signed)
[facility] History and Physical  [name] YNW:[id number] DOB: [birth year] DOA: 03/01/2012  Referring physician: Dr. Franky Macho. PCP: Carollee Herter, MD  Specialists: Dr. Elnoria Howard.  Chief Complaint: Abdominal pain and shortness of breath.  HPI: Christine Silva is a 67 y.o. female history of diabetes mellitus type 2, hypertension, hyperlipidemia and arthritis presented to the ER with complaints of abdominal pain. The pain is mostly on the left lower quadrant colicky in nature with some nausea and poor appetite denies any diarrhea. Denies any recent use of antibiotics. Denies any fever chills. In addition patient is also having shortness of breath since yesterday. Shortness of breath is exertional and has nonproductive cough. Patient had come yesterday with complaints of abdominal pain and at that time CT abdomen and pelvis showed large collection of fluid and UA were showing positive UTI patient was discharged on Cipro and Flagyl with pain relief medications. Despite taking this patient still had abdominal discomfort and presented back to the ER last evening with in addition complains of shortness of breath. Patient otherwise denies any chest pain. In the ER chest x-ray shows pleural effusion and reviewing her CT abdomen shows mild pericardial effusion. EKG shows sinus tachycardia. BNP and cardiac enzymes are negative d-dimer is elevated. Levaquin was started for possible pneumonia. Patient has been admitted for further management.  Review of Systems: The patient denies anorexia, fever, weight loss,, vision loss, decreased hearing, hoarseness, chest pain, syncope, peripheral edema, balance deficits, hemoptysis, melena, hematochezia, severe indigestion/heartburn, hematuria, incontinence, genital sores, muscle weakness, suspicious skin lesions, transient blindness, difficulty walking, depression, unusual weight change, abnormal bleeding, enlarged lymph nodes, angioedema, and breast masses. Has  abdominal pain and shortness of breath.  Past Medical History  Diagnosis Date  . Hyperlipidemia   . Hypertension   . Diabetes mellitus   . Anemia   . Other specified disorder of rectum and anus    Past Surgical History  Procedure Laterality Date  . Cesarean section     Social History:  reports that she has never smoked. She has never used smokeless tobacco. She reports that she does not drink alcohol or use illicit drugs. Lives at home. where does patient live--home, ALF, SNF? and with whom if at home? Yes. Can patient participate in ADLs?  No Known Allergies  Family History  Problem Relation Age of Onset  . Diabetes Mellitus II Mother     Prior to Admission medications   Medication Sig Start Date End Date Taking? Authorizing Provider  aspirin 81 MG tablet Take 81 mg by mouth daily.   Yes Historical Provider, MD  ciprofloxacin (CIPRO) 500 MG tablet Take 1 tablet (500 mg total) by mouth 2 (two) times daily. 03/01/12  Yes Renne Crigler, PA  diclofenac (VOLTAREN) 75 MG EC tablet Take 1 tablet (75 mg total) by mouth 2 (two) times daily. 04/29/11 04/28/12 Yes Ronnald Nian, MD  diclofenac sodium (VOLTAREN) 1 % GEL Apply 1 application topically 4 (four) times daily. 08/23/11  Yes Ronnald Nian, MD  HYDROcodone-acetaminophen (NORCO/VICODIN) 5-325 MG per tablet Take one-half to one tablets PO every 6 hours as needed for severe pain 03/01/12  Yes Renne Crigler, PA  ibuprofen (ADVIL,MOTRIN) 200 MG tablet Take 200 mg by mouth every 6 (six) hours as needed for pain.   Yes Historical Provider, MD  insulin glargine (LANTUS) 100 UNIT/ML injection Inject 10 Units into the skin every morning. 12/22/11  Yes Ronnald Nian, MD  losartan-hydrochlorothiazide [facility]) 50-12.5 MG per tablet Take 1 tablet  by mouth daily. 04/29/11  Yes Ronnald Nian, MD  meloxicam (MOBIC) 15 MG tablet TAKE ONE TABLET BY MOUTH EVERY DAY 02/08/11  Yes Ronnald Nian, MD  metFORMIN (GLUCOPHAGE) 500 MG tablet Take 1 tablet (500 mg  total) by mouth 2 (two) times daily with a meal. 12/22/11  Yes Ronnald Nian, MD  metroNIDAZOLE (FLAGYL) 500 MG tablet Take 1 tablet (500 mg total) by mouth 2 (two) times daily. 03/01/12  Yes Renne Crigler, PA  pravastatin (PRAVACHOL) 40 MG tablet Take 1 tablet (40 mg total) by mouth daily. 04/29/11  Yes Ronnald Nian, MD  Pseudoeph-Doxylamine-DM-APAP (NYQUIL) 60-7.06-15-998 MG/30ML LIQD Take 30 mLs by mouth every 6 (six) hours as needed (cough).   Yes Historical Provider, MD  ondansetron (ZOFRAN ODT) 8 MG disintegrating tablet Take 1 tablet (8 mg total) by mouth every 8 (eight) hours as needed for nausea. 03/01/12   Renne Crigler, PA   Physical Exam: Filed Vitals:   03/01/12 2305 03/02/12 0024 03/02/12 0445  BP: 137/67  112/49  Pulse: 111  108  Temp: 99.1 F (37.3 C)    TempSrc: Oral    Resp: 16  24  SpO2: 96% 97% 95%     General:  Well-built and well-nourished.  Eyes: Anicteric no pallor.  ENT: No discharge from ears eyes or nose. Tongue appears dry.  Neck: No mass felt.  Cardiovascular: S1-S2 heard.  Respiratory: Bilateral air entry present. No rhonchi no crepitations.  Abdomen: Soft. Mild tenderness in the left lower quadrant. No guarding no rigidity.  Skin: No rash or erythema.  Musculoskeletal: Abdomen appears tender.  Psychiatric: Appears normal.  Neurologic: Alert awake oriented to time place and person. Moves all extremities.  Labs on Admission:  Basic Metabolic Panel:  Recent Labs Lab 03/01/12 1001 03/02/12 0110  NA 130* 129*  K 4.6 4.3  CL 94* 97  CO2 22 21  GLUCOSE 363* 240*  BUN 25* 20  CREATININE 1.36* 1.07  CALCIUM 9.3 8.6   Liver Function Tests:  Recent Labs Lab 03/01/12 1001 03/02/12 0110  AST 17 14  ALT 9 10  ALKPHOS 73 62  BILITOT 1.3* 1.0  PROT 6.6 6.0  ALBUMIN 3.6 3.1*    Recent Labs Lab 03/01/12 1001  LIPASE 27   No results found for this basename: AMMONIA,  in the last 168 hours CBC:  Recent Labs Lab 03/01/12 1001  03/02/12 0110  WBC 13.1* 14.4*  NEUTROABS 11.1* 10.9*  HGB 10.3* 9.5*  HCT 29.7* 26.0*  MCV 101.0* 101.6*  PLT 649* 560*   Cardiac Enzymes:  Recent Labs Lab 03/02/12 0110  TROPONINI <0.30    BNP (last 3 results)  Recent Labs  03/02/12 0110  PROBNP 66.3   CBG: No results found for this basename: GLUCAP,  in the last 168 hours  Radiological Exams on Admission: Dg Chest 2 View  03/01/2012  *RADIOLOGY REPORT*  Clinical Data: Shortness of breath.  CHEST - 2 VIEW  Comparison: None.  Findings: Moderate bilateral pleural effusions are noted. Underlying pneumonia or atelectasis cannot be excluded. Cardiomediastinal silhouette appears normal.  Impression:  Moderate bilateral pleural effusions with possible underlying pneumonia or atelectasis.   Original Report Authenticated By: Lupita Raider.,  M.D.    US Abdomen Complete  03/02/2012  *RADIOLOGY REPORT*  Clinical Data:  Abdominal pain.  ABDOMINAL ULTRASOUND COMPLETE  Comparison:  CT of the abdomen and pelvis performed 03/01/2012, and abdominal ultrasound performed 07/18/2008  Findings:  Gallbladder:  Multiple small stones are  seen layering dependently within the gallbladder.  These are mobile in nature.  No gallbladder wall thickening or pericholecystic fluid is seen.  No ultrasonographic Murphy's sign is elicited.  Common Bile Duct:  0.7 cm in diameter; borderline normal for the patient's age.  Liver:  Normal parenchymal echogenicity and echotexture; no focal lesions identified.  Limited Doppler evaluation demonstrates normal blood flow within the liver.  IVC:  Unremarkable in appearance.  Pancreas:  Although the pancreas is difficult to visualize in its entirety due to overlying bowel gas, no focal pancreatic abnormality is identified.  Spleen:  9.6 cm in length; within normal limits in size and echotexture.  Right kidney:  10.8 cm in length; normal in size, configuration and parenchymal echogenicity.  No evidence of mass or hydronephrosis.   Left kidney:  13.0 cm in length; normal in size, configuration and parenchymal echogenicity.  No evidence of mass or hydronephrosis. Not well characterized due to overlying bowel gas.  Abdominal Aorta:  Normal in caliber; no aneurysm identified.  Small bilateral pleural effusions are seen, right greater than left.  IMPRESSION:  1.  No acute abnormality seen within the abdomen. 2.  Cholelithiasis again noted; gallbladder otherwise unremarkable in appearance.   Original Report Authenticated By: Tonia Ghent, M.D.    Ct Abdomen Pelvis W Contrast  03/01/2012  *RADIOLOGY REPORT*  Clinical Data: , history hypertension, diabetes, hyperlipidemia  CT ABDOMEN AND PELVIS WITH CONTRAST  Technique:  Multidetector CT imaging of the abdomen and pelvis was performed following the standard protocol during bolus administration of intravenous contrast. Sagittal and coronal MPR images reconstructed from axial data set.  Contrast: OMNIPAQUE IOHEXOL 300 MG/ML  SOLN Dilute oral contrast.  Comparison: 04/01/2009  Findings: Bilateral pleural effusions and basilar atelectasis greater on the right. Minimal pericardial effusion. Distended gallbladder with dependent calculi. No definite gallbladder wall thickening or pericholecystic edema by CT. Stable right adrenal nodule 16 x 11 mm image 19. Tiny left renal cysts.  Remainder of liver, spleen, kidneys, and adrenal glands normal appearance. Pancreas enhances normally without discrete mass or calcification. Small umbilical hernia containing fat. Normal appendix. Right inguinal hernia containing fat. Unremarkable bladder, uterus and adnexae.  Extensive edema is seen throughout the retroperitoneum extending into the pelvis and into presacral space. Minimal ascites. No mass, adenopathy or free air. Stomach and bowel loops grossly normal appearance. No acute osseous findings.  IMPRESSION: Bibasilar effusions and atelectasis greater on right. Minimal pericardial effusion. Stable right  adrenal nodule. Cholelithiasis without definite evidence of cholecystitis by CT, which is insensitive. Right inguinal and umbilical hernias and containing fat. Extensive edema throughout the retroperitoneum beginning inferior to pancreas extending into the presacral space; this is nonspecific and could be seen with nonspecific infection, pancreatitis, urinary tract infection. No GI source is identified. The lack of distention of the IVC and hepatic veins makes failure less likely.  No definite subcutaneous or additional soft tissue edema seen to suggest this is slowly related to hypoproteinemia. Correlation with urinalysis and serum amylase recommended. If there is clinical suspicion of acute cholecystitis, recommend ultrasound assessment.  Findings called to Renne Crigler on 03/01/2012 at 1418 hours.   Original Report Authenticated By: Ulyses Southward, M.D.     EKG: Independently reviewed. Sinus tachycardia.  Assessment/Plan Principal Problem:   Abdominal pain Active Problems:   Diabetes mellitus due to underlying condition with diabetic neuropathy   Pleural effusion   SOB (shortness of breath)   1. Abdominal pain - causes not clearly known. CT does show large  amount of retroperitoneal fluid. UA was showing possible UTI. Patient does have cholelithiasis on sonogram does not show any features of cholecystitis. I have consulted GI for further recommendations. Also ordered HIDA scan. Patient states she had similar episode 3 years ago and at that time Dr. Elnoria Howard had done colonoscopy which was unremarkable and eventually patient was referred to [facility] and workup over there was negative. 2. Shortness of breath with pleural effusion - have discussed the radiologist and at this time I have ordered CT angiogram of the chest. Based on these findings we'll have further recommendations. Consult pulmonary for pleural effusion. I have ordered 2-D echo for mild peripheral effusion. Patient will be continued on  Levaquin for possible pneumonia. 3. Diabetes mellitus2 - patient's appetite is poor. Closely follow CBG. 4. Hypertension - continue home medications. 5. Hyperlipidemia - continue home medications. 6. Arthritis - discontinue NSAIDs for now. Patient is on hydrocodone for pain relief.  Consulted gastroenterologist Dr. Loreta Ave. if consultant consulted, please document name and whether formally or informally consulted  Code Status: Full code. (must indicate code status--if unknown or must be presumed, indicate so) Family Communication: None at the bedside. (indicate person spoken with, if applicable, with phone number if by telephone) Disposition Plan: Admit to inpatient.   KAKRAKANDY,ARSHAD N. [facility] Pager [phone].  If 7PM-7AM, please contact night-coverage [email or web address] Password [facility] 03/02/2012, 6:56 AM

## 2012-03-02 NOTE — ED Notes (Signed)
Spoke with Resp for order for ABG

## 2012-03-02 NOTE — Consult Note (Signed)
Reason for Consult: ABM pain. Referring Physician: [facility].  Christine Silva HPI: This is a 67 year old female who is well-known to me for a history of proctitis and LLQ abdominal pain.  At one point, her colonoscopy revealed an isolated patch if colitis in the left side of the colon that was consistent with ischemia.  Remarkably the patient responded to the use of hyoscyamine.  She reports having the same type of pain in the LLQ, but she did not have any hyoscyamine at home.  The patient presented to the ER and an abdominal CT scan was performed with findings of retroperitoneal edema without any overt source.  An UTI was diagnosed and she was sent home, but she represented the same day with SOB.  No complaints of chest pain, diarrhea, hematochezia, or melena.  Past Medical History  Diagnosis Date  . Hyperlipidemia   . Hypertension   . Diabetes mellitus   . Anemia   . Other specified disorder of rectum and anus     Past Surgical History  Procedure Laterality Date  . Cesarean section      Family History  Problem Relation Age of Onset  . Diabetes Mellitus II Mother     Social History:  reports that she has never smoked. She has never used smokeless tobacco. She reports that she does not drink alcohol or use illicit drugs.  Allergies: No Known Allergies  Medications:  Scheduled: . heparin  5,000 Units Subcutaneous Q8H  . hydrochlorothiazide  12.5 mg Oral Daily  . insulin aspart  0-9 Units Subcutaneous TID WC  . insulin glargine  10 Units Subcutaneous q morning - 10a  . losartan  50 mg Oral Daily  . simvastatin  20 mg Oral q1800  . sodium chloride  3 mL Intravenous Q12H  . sodium chloride  3 mL Intravenous Q12H   Continuous:   Results for orders placed during the hospital encounter of 03/01/12 (from the past 24 hour(s))  CBC WITH DIFFERENTIAL     Status: Abnormal   Collection Time    03/02/12  1:10 AM      Result Value Range   WBC 14.4 (*) 4.0 - 10.5 K/uL   RBC  2.56 (*) 3.87 - 5.11 MIL/uL   Hemoglobin 9.5 (*) 12.0 - 15.0 g/dL   HCT 16.1 (*) 09.6 - 04.5 %   MCV 101.6 (*) 78.0 - 100.0 fL   MCH 37.1 (*) 26.0 - 34.0 pg   MCHC 36.5 (*) 30.0 - 36.0 g/dL   RDW 40.9 (*) 81.1 - 91.4 %   Platelets 560 (*) 150 - 400 K/uL   Neutrophils Relative 75  43 - 77 %   Lymphocytes Relative 14  12 - 46 %   Monocytes Relative 10  3 - 12 %   Eosinophils Relative 1  0 - 5 %   Basophils Relative 0  0 - 1 %   Neutro Abs 10.9 (*) 1.7 - 7.7 K/uL   Lymphs Abs 2.0  0.7 - 4.0 K/uL   Monocytes Absolute 1.4 (*) 0.1 - 1.0 K/uL   Eosinophils Absolute 0.1  0.0 - 0.7 K/uL   Basophils Absolute 0.0  0.0 - 0.1 K/uL   RBC Morphology POLYCHROMASIA PRESENT     Smear Review MORPHOLOGY UNREMARKABLE    PRO B NATRIURETIC PEPTIDE     Status: None   Collection Time    03/02/12  1:10 AM      Result Value Range   Pro B  Natriuretic peptide (BNP) 66.3  0 - 125 pg/mL  TROPONIN I     Status: None   Collection Time    03/02/12  1:10 AM      Result Value Range   Troponin I <0.30  <0.30 ng/mL  COMPREHENSIVE METABOLIC PANEL     Status: Abnormal   Collection Time    03/02/12  1:10 AM      Result Value Range   Sodium 129 (*) 135 - 145 mEq/L   Potassium 4.3  3.5 - 5.1 mEq/L   Chloride 97  96 - 112 mEq/L   CO2 21  19 - 32 mEq/L   Glucose, Bld 240 (*) 70 - 99 mg/dL   BUN 20  6 - 23 mg/dL   Creatinine, Ser 1.61  0.50 - 1.10 mg/dL   Calcium 8.6  8.4 - 09.6 mg/dL   Total Protein 6.0  6.0 - 8.3 g/dL   Albumin 3.1 (*) 3.5 - 5.2 g/dL   AST 14  0 - 37 U/L   ALT 10  0 - 35 U/L   Alkaline Phosphatase 62  39 - 117 U/L   Total Bilirubin 1.0  0.3 - 1.2 mg/dL   GFR calc non Af Amer 53 (*) >90 mL/min   GFR calc Af Amer 61 (*) >90 mL/min  BLOOD GAS, ARTERIAL     Status: Abnormal   Collection Time    03/02/12  2:19 AM      Result Value Range   O2 Content 2.0     Delivery systems NASAL CANNULA     pH, Arterial 7.371  7.350 - 7.450   pCO2 arterial 39.5  35.0 - 45.0 mmHg   pO2, Arterial 65.9 (*)  80.0 - 100.0 mmHg   Bicarbonate 22.3  20.0 - 24.0 mEq/L   TCO2 21.1  0 - 100 mmol/L   Acid-base deficit 2.2 (*) 0.0 - 2.0 mmol/L   O2 Saturation 92.6     Patient temperature 98.6     Collection site RIGHT RADIAL     Drawn by [phone]     Sample type ARTERIAL DRAW     Allens test (pass/fail) PASS  PASS  D-DIMER, QUANTITATIVE     Status: Abnormal   Collection Time    03/02/12  2:50 AM      Result Value Range   D-Dimer, Quant 1.58 (*) 0.00 - 0.48 ug/mL-FEU  COMPREHENSIVE METABOLIC PANEL     Status: Abnormal   Collection Time    03/02/12  8:12 AM      Result Value Range   Sodium 127 (*) 135 - 145 mEq/L   Potassium 4.2  3.5 - 5.1 mEq/L   Chloride 94 (*) 96 - 112 mEq/L   CO2 24  19 - 32 mEq/L   Glucose, Bld 259 (*) 70 - 99 mg/dL   BUN 18  6 - 23 mg/dL   Creatinine, Ser 9.81  0.50 - 1.10 mg/dL   Calcium 8.7  8.4 - 19.1 mg/dL   Total Protein 5.6 (*) 6.0 - 8.3 g/dL   Albumin 3.1 (*) 3.5 - 5.2 g/dL   AST 14  0 - 37 U/L   ALT 10  0 - 35 U/L   Alkaline Phosphatase 56  39 - 117 U/L   Total Bilirubin 1.1  0.3 - 1.2 mg/dL   GFR calc non Af Amer 60 (*) >90 mL/min   GFR calc Af Amer 69 (*) >90 mL/min  CBC WITH DIFFERENTIAL  Status: Abnormal   Collection Time    03/02/12  8:12 AM      Result Value Range   WBC 11.6 (*) 4.0 - 10.5 K/uL   RBC 2.45 (*) 3.87 - 5.11 MIL/uL   Hemoglobin 8.6 (*) 12.0 - 15.0 g/dL   HCT 13.2 (*) 44.0 - 10.2 %   MCV 100.8 (*) 78.0 - 100.0 fL   MCH 35.1 (*) 26.0 - 34.0 pg   MCHC 34.8  30.0 - 36.0 g/dL   RDW 72.5 (*) 36.6 - 44.0 %   Platelets 515 (*) 150 - 400 K/uL   Neutrophils Relative 75  43 - 77 %   Neutro Abs 8.7 (*) 1.7 - 7.7 K/uL   Lymphocytes Relative 14  12 - 46 %   Lymphs Abs 1.6  0.7 - 4.0 K/uL   Monocytes Relative 10  3 - 12 %   Monocytes Absolute 1.1 (*) 0.1 - 1.0 K/uL   Eosinophils Relative 1  0 - 5 %   Eosinophils Absolute 0.2  0.0 - 0.7 K/uL   Basophils Relative 0  0 - 1 %   Basophils Absolute 0.0  0.0 - 0.1 K/uL  LIPASE, BLOOD     Status:  None   Collection Time    03/02/12  8:12 AM      Result Value Range   Lipase 22  11 - 59 U/L  GLUCOSE, CAPILLARY     Status: Abnormal   Collection Time    03/02/12 10:42 AM      Result Value Range   Glucose-Capillary 243 (*) 70 - 99 mg/dL   Comment 1 Documented in Chart     Comment 2 Notify RN       Dg Chest 2 View  03/01/2012  *RADIOLOGY REPORT*  Clinical Data: Shortness of breath.  CHEST - 2 VIEW  Comparison: None.  Findings: Moderate bilateral pleural effusions are noted. Underlying pneumonia or atelectasis cannot be excluded. Cardiomediastinal silhouette appears normal.  Impression:  Moderate bilateral pleural effusions with possible underlying pneumonia or atelectasis.   Original Report Authenticated By: Lupita Raider.,  M.D.    Ct Angio Chest Pe W/cm &/or Wo Cm  03/02/2012  *RADIOLOGY REPORT*  Clinical Data: Shortness of breath.  CT ANGIOGRAPHY CHEST  Technique:  Multidetector CT imaging of the chest using the standard protocol during bolus administration of intravenous contrast. Multiplanar reconstructed images including MIPs were obtained and reviewed to evaluate the vascular anatomy.  Contrast: 80mL OMNIPAQUE IOHEXOL 350 MG/ML SOLN  Comparison: PA and lateral chest 03/01/2012.  CT chest 08/04/2005.  Findings: No pulmonary embolus is identified.  The patient has moderate to moderately large bilateral pleural effusions.  There is infiltration of subcutaneous fat along the anterior aspect of the chest wall centrally and on the left.  No focal fluid collection in the subcutaneous tissues is identified.  There is no axillary, hilar or mediastinal lymphadenopathy.  Cardiomegaly is noted. Lungs demonstrate bilateral compressive atelectasis.  There is also some airspace disease in the lingula with some air bronchograms identified.  Scattered ground-glass attenuation is noted. Incidentally imaged upper abdomen is unremarkable.  No focal bony abnormality is identified.  IMPRESSION:  1.  Negative  for pulmonary embolus. 2.  Moderate to moderately large bilateral pleural effusions with associated compressive atelectasis.  There is also some airspace disease in the lingula which could be atelectasis or pneumonia. 3.  Infiltration of subcutaneous fat of the upper anterior chest wall centrally and on the left could be  due to subcutaneous edema, contusion or cellulitis.   Original Report Authenticated By: Holley Dexter, M.D.    US Abdomen Complete  03/02/2012  *RADIOLOGY REPORT*  Clinical Data:  Abdominal pain.  ABDOMINAL ULTRASOUND COMPLETE  Comparison:  CT of the abdomen and pelvis performed 03/01/2012, and abdominal ultrasound performed 07/18/2008  Findings:  Gallbladder:  Multiple small stones are seen layering dependently within the gallbladder.  These are mobile in nature.  No gallbladder wall thickening or pericholecystic fluid is seen.  No ultrasonographic Murphy's sign is elicited.  Common Bile Duct:  0.7 cm in diameter; borderline normal for the patient's age.  Liver:  Normal parenchymal echogenicity and echotexture; no focal lesions identified.  Limited Doppler evaluation demonstrates normal blood flow within the liver.  IVC:  Unremarkable in appearance.  Pancreas:  Although the pancreas is difficult to visualize in its entirety due to overlying bowel gas, no focal pancreatic abnormality is identified.  Spleen:  9.6 cm in length; within normal limits in size and echotexture.  Right kidney:  10.8 cm in length; normal in size, configuration and parenchymal echogenicity.  No evidence of mass or hydronephrosis.  Left kidney:  13.0 cm in length; normal in size, configuration and parenchymal echogenicity.  No evidence of mass or hydronephrosis. Not well characterized due to overlying bowel gas.  Abdominal Aorta:  Normal in caliber; no aneurysm identified.  Small bilateral pleural effusions are seen, right greater than left.  IMPRESSION:  1.  No acute abnormality seen within the abdomen. 2.   Cholelithiasis again noted; gallbladder otherwise unremarkable in appearance.   Original Report Authenticated By: Tonia Ghent, M.D.    Ct Abdomen Pelvis W Contrast  03/01/2012  *RADIOLOGY REPORT*  Clinical Data: , history hypertension, diabetes, hyperlipidemia  CT ABDOMEN AND PELVIS WITH CONTRAST  Technique:  Multidetector CT imaging of the abdomen and pelvis was performed following the standard protocol during bolus administration of intravenous contrast. Sagittal and coronal MPR images reconstructed from axial data set.  Contrast: OMNIPAQUE IOHEXOL 300 MG/ML  SOLN Dilute oral contrast.  Comparison: 04/01/2009  Findings: Bilateral pleural effusions and basilar atelectasis greater on the right. Minimal pericardial effusion. Distended gallbladder with dependent calculi. No definite gallbladder wall thickening or pericholecystic edema by CT. Stable right adrenal nodule 16 x 11 mm image 19. Tiny left renal cysts.  Remainder of liver, spleen, kidneys, and adrenal glands normal appearance. Pancreas enhances normally without discrete mass or calcification. Small umbilical hernia containing fat. Normal appendix. Right inguinal hernia containing fat. Unremarkable bladder, uterus and adnexae.  Extensive edema is seen throughout the retroperitoneum extending into the pelvis and into presacral space. Minimal ascites. No mass, adenopathy or free air. Stomach and bowel loops grossly normal appearance. No acute osseous findings.  IMPRESSION: Bibasilar effusions and atelectasis greater on right. Minimal pericardial effusion. Stable right adrenal nodule. Cholelithiasis without definite evidence of cholecystitis by CT, which is insensitive. Right inguinal and umbilical hernias and containing fat. Extensive edema throughout the retroperitoneum beginning inferior to pancreas extending into the presacral space; this is nonspecific and could be seen with nonspecific infection, pancreatitis, urinary tract infection. No GI  source is identified. The lack of distention of the IVC and hepatic veins makes failure less likely.  No definite subcutaneous or additional soft tissue edema seen to suggest this is slowly related to hypoproteinemia. Correlation with urinalysis and serum amylase recommended. If there is clinical suspicion of acute cholecystitis, recommend ultrasound assessment.  Findings called to Renne Crigler on 03/01/2012 at 1418 hours.  Original Report Authenticated By: Ulyses Southward, M.D.     ROS:  As stated above in the HPI otherwise negative.  Blood pressure 140/63, pulse 110, temperature 98.4 F (36.9 C), temperature source Oral, resp. rate 22, height 5\' 4"  (1.626 m), weight 0 lb (0 kg), SpO2 96.00%.    PE: Gen: Distressed and freightened, Alert and Oriented HEENT:  [place]/AT, EOMI Neck: Supple, no LAD Lungs: CTA Bilaterally CV: RRR without M/G/R ABM: Soft, tender in the LLQ, +BS Ext: No C/C/E  Assessment/Plan: 1) LLQ abdominal pain - Intermittent and chronic. 2) Retroperitoneal edema. 3) SOB.   The patient does have true disease, i.e., proctitis and a history of isolated ischemic colitis.  I did refer her to [facility] for a second opinion, but no other etiology was identified.  She has always responded to hyoscyamine for her LLQ pain.  I suspect there is a functional component to her pain.  I cannot explain her retroperitoneal edema, but she does have pleural effusions and minimal pericardial effusion.  ? CHF.  A UTI was diagnosed, but she only exhibited 7-10 WBC, which is not impressive.  Plan: 1) Start hyoscyamine. 2) Agree with cardiac work up and ruling out PE.  Briceyda Abdullah D 03/02/2012, 12:26 PM

## 2012-03-02 NOTE — Progress Notes (Signed)
Echocardiogram 2D Echocardiogram has been performed.  Christine Silva 03/02/2012, 12:21 PM

## 2012-03-02 NOTE — ED Notes (Signed)
Unable to access blood return with IV start. Will attempt again

## 2012-03-02 NOTE — Progress Notes (Addendum)
[facility] PROGRESS NOTE  Christine Silva:[id number] DOB: 1945-11-13 DOA: 03/01/2012 PCP: Carollee Herter, MD  Assessment/Plan  Abdominal pain - Unclear etiology, but has previously been worked up by Dr. Elnoria Howard, who referred her to [facility].  She has responded to hyoscyamine in the past -  Appreciate GI recommendations  Shortness of breath with pleural effusion.  Pneumonia less likely to cause bilateral effusions -  CTa neg for PE -  ECHO negative for diastolic and systolic heart failure  -  No proteinuria or significant hypoalbuminemia -  Appreciate Pulmonology recommendations  - TSH pending -  Hold NSAIDS -  Gentle diuresis -  Wean oxygen as tolerated   UTI, complicated by diabetes -  Continue abx -  F/u urine culture  CAP:  Possible pneumonia vs. Atelectasis of lingula -  Continue levofloxacin  Diabetes mellitus2 - CBGs trending down -  Continue lantus 10 -  Continue SSI low dose  Hypertension with low BPs today -  Agree with holding remaining BP medications   Hyperlipidemia - continue home medications.  Arthritis - discontinue NSAIDs for now. Patient is on hydrocodone for pain relief.  Hyponatremia, worsening, may be due to SIADH from respiratory process -  Free water restrict when starting to tolerate fluids/foods  Acute kidney injury, creatinine trending down -  Minimize nephrotoxins -  At risk for CIN so will start some gentle diuresis tomorrow given CTa today  Leukocytosis, improving -  Continue abx  Anemia, macrocytic, trending down, near baseline of 9 - TSH, B12, folate.  MM less likely as no globulin gap -  SCDs -  Occult stool was neg -  Agree with PPI  Thrombocytosis:  May be reactive from acute infection, baseline plts are high -  Consider essential thrombocytosis -  Consider JAK2  Diet:  CLD and advance as tolerated to diabetic Access:  PIV IVF:  None Proph:  SCDs  Code Status: full code Family Communication: spoke with patient  alone Disposition Plan: pending improvement in SOB,    Consultants:  GI  Pulmology  Procedures:  CTa chest  CT ab/pelvis  Abd [place]  CXR  Antibiotics:  Levofloxacin 2/14 >>    HPI/Subjective: Patient states that she feels less SOB with the oxygen, but still has cough and generalized malaise.  She has nausea and abdominal discomfort, denies diarrhea.    Objective: Filed Vitals:   03/02/12 0445 03/02/12 1015 03/02/12 1300 03/02/12 1700  BP: 112/49 140/63 95/42   Pulse: 108 110 102   Temp:  98.4 F (36.9 C) 97.7 F (36.5 C)   TempSrc:  Oral Oral   Resp: 24 22 20    Height:  5\' 4"  (1.626 m)    Weight:    74.844 kg (165 lb)  SpO2: 95% 96% 97%     Intake/Output Summary (Last 24 hours) at 03/02/12 1930 Last data filed at 03/02/12 1300  Gross per 24 hour  Intake    480 ml  Output      0 ml  Net    480 ml   Filed Weights   03/02/12 1700  Weight: 74.844 kg (165 lb)    Exam:   General:  Average to overweight AAF, no acute distress, nasal canula in place  HEENT:  MMM  Cardiovascular:  RRR, 1/6 murmur at the LSB, no rubs or gallops, 2+ pulses, warm extremities  Respiratory:  Diminished bilateral breath sounds with dullness to percussion at bases.  No wheezes, some scattered rales at mid back  Abdomen:  NABS, soft, ND, mildly tender to palpation diffusely without rebound or guarding   MSK:  Normal tone and bulk  Neuro:  Grossly intact  Data Reviewed: Basic Metabolic Panel:  Recent Labs Lab 03/01/12 1001 03/02/12 0110 03/02/12 0812  NA 130* 129* 127*  K 4.6 4.3 4.2  CL 94* 97 94*  CO2 22 21 24   GLUCOSE 363* 240* 259*  BUN 25* 20 18  CREATININE 1.36* 1.07 0.97  CALCIUM 9.3 8.6 8.7   Liver Function Tests:  Recent Labs Lab 03/01/12 1001 03/02/12 0110 03/02/12 0812  AST 17 14 14   ALT 9 10 10   ALKPHOS 73 62 56  BILITOT 1.3* 1.0 1.1  PROT 6.6 6.0 5.6*  ALBUMIN 3.6 3.1* 3.1*    Recent Labs Lab 03/01/12 1001 03/02/12 0812  LIPASE 27 22    No results found for this basename: AMMONIA,  in the last 168 hours CBC:  Recent Labs Lab 03/01/12 1001 03/02/12 0110 03/02/12 0812  WBC 13.1* 14.4* 11.6*  NEUTROABS 11.1* 10.9* 8.7*  HGB 10.3* 9.5* 8.6*  HCT 29.7* 26.0* 24.7*  MCV 101.0* 101.6* 100.8*  PLT 649* 560* 515*   Cardiac Enzymes:  Recent Labs Lab 03/02/12 0110  TROPONINI <0.30   BNP (last 3 results)  Recent Labs  03/02/12 0110  PROBNP 66.3   CBG:  Recent Labs Lab 03/02/12 0801 03/02/12 1042 03/02/12 1301 03/02/12 1750  GLUCAP 258* 243* 207* 169*    Recent Results (from the past 240 hour(s))  URINE CULTURE     Status: None   Collection Time    03/01/12 10:33 AM      Result Value Range Status   Specimen Description URINE, CLEAN CATCH   Final   Special Requests NONE   Final   Culture  Setup Time 03/01/2012 15:28   Final   Colony Count NO GROWTH   Final   Culture NO GROWTH   Final   Report Status 03/02/2012 FINAL   Final     Studies: Dg Chest 2 View  03/01/2012  *RADIOLOGY REPORT*  Clinical Data: Shortness of breath.  CHEST - 2 VIEW  Comparison: None.  Findings: Moderate bilateral pleural effusions are noted. Underlying pneumonia or atelectasis cannot be excluded. Cardiomediastinal silhouette appears normal.  Impression:  Moderate bilateral pleural effusions with possible underlying pneumonia or atelectasis.   Original Report Authenticated By: Lupita Raider.,  M.D.    Ct Angio Chest Pe W/cm &/or Wo Cm  03/02/2012  *RADIOLOGY REPORT*  Clinical Data: Shortness of breath.  CT ANGIOGRAPHY CHEST  Technique:  Multidetector CT imaging of the chest using the standard protocol during bolus administration of intravenous contrast. Multiplanar reconstructed images including MIPs were obtained and reviewed to evaluate the vascular anatomy.  Contrast: 80mL OMNIPAQUE IOHEXOL 350 MG/ML SOLN  Comparison: PA and lateral chest 03/01/2012.  CT chest 08/04/2005.  Findings: No pulmonary embolus is identified.  The  patient has moderate to moderately large bilateral pleural effusions.  There is infiltration of subcutaneous fat along the anterior aspect of the chest wall centrally and on the left.  No focal fluid collection in the subcutaneous tissues is identified.  There is no axillary, hilar or mediastinal lymphadenopathy.  Cardiomegaly is noted. Lungs demonstrate bilateral compressive atelectasis.  There is also some airspace disease in the lingula with some air bronchograms identified.  Scattered ground-glass attenuation is noted. Incidentally imaged upper abdomen is unremarkable.  No focal bony abnormality is identified.  IMPRESSION:  1.  Negative for pulmonary  embolus. 2.  Moderate to moderately large bilateral pleural effusions with associated compressive atelectasis.  There is also some airspace disease in the lingula which could be atelectasis or pneumonia. 3.  Infiltration of subcutaneous fat of the upper anterior chest wall centrally and on the left could be due to subcutaneous edema, contusion or cellulitis.   Original Report Authenticated By: Holley Dexter, M.D.    US Abdomen Complete  03/02/2012  *RADIOLOGY REPORT*  Clinical Data:  Abdominal pain.  ABDOMINAL ULTRASOUND COMPLETE  Comparison:  CT of the abdomen and pelvis performed 03/01/2012, and abdominal ultrasound performed 07/18/2008  Findings:  Gallbladder:  Multiple small stones are seen layering dependently within the gallbladder.  These are mobile in nature.  No gallbladder wall thickening or pericholecystic fluid is seen.  No ultrasonographic Murphy's sign is elicited.  Common Bile Duct:  0.7 cm in diameter; borderline normal for the patient's age.  Liver:  Normal parenchymal echogenicity and echotexture; no focal lesions identified.  Limited Doppler evaluation demonstrates normal blood flow within the liver.  IVC:  Unremarkable in appearance.  Pancreas:  Although the pancreas is difficult to visualize in its entirety due to overlying bowel gas, no  focal pancreatic abnormality is identified.  Spleen:  9.6 cm in length; within normal limits in size and echotexture.  Right kidney:  10.8 cm in length; normal in size, configuration and parenchymal echogenicity.  No evidence of mass or hydronephrosis.  Left kidney:  13.0 cm in length; normal in size, configuration and parenchymal echogenicity.  No evidence of mass or hydronephrosis. Not well characterized due to overlying bowel gas.  Abdominal Aorta:  Normal in caliber; no aneurysm identified.  Small bilateral pleural effusions are seen, right greater than left.  IMPRESSION:  1.  No acute abnormality seen within the abdomen. 2.  Cholelithiasis again noted; gallbladder otherwise unremarkable in appearance.   Original Report Authenticated By: Tonia Ghent, M.D.    Ct Abdomen Pelvis W Contrast  03/01/2012  *RADIOLOGY REPORT*  Clinical Data: , history hypertension, diabetes, hyperlipidemia  CT ABDOMEN AND PELVIS WITH CONTRAST  Technique:  Multidetector CT imaging of the abdomen and pelvis was performed following the standard protocol during bolus administration of intravenous contrast. Sagittal and coronal MPR images reconstructed from axial data set.  Contrast: OMNIPAQUE IOHEXOL 300 MG/ML  SOLN Dilute oral contrast.  Comparison: 04/01/2009  Findings: Bilateral pleural effusions and basilar atelectasis greater on the right. Minimal pericardial effusion. Distended gallbladder with dependent calculi. No definite gallbladder wall thickening or pericholecystic edema by CT. Stable right adrenal nodule 16 x 11 mm image 19. Tiny left renal cysts.  Remainder of liver, spleen, kidneys, and adrenal glands normal appearance. Pancreas enhances normally without discrete mass or calcification. Small umbilical hernia containing fat. Normal appendix. Right inguinal hernia containing fat. Unremarkable bladder, uterus and adnexae.  Extensive edema is seen throughout the retroperitoneum extending into the pelvis and into  presacral space. Minimal ascites. No mass, adenopathy or free air. Stomach and bowel loops grossly normal appearance. No acute osseous findings.  IMPRESSION: Bibasilar effusions and atelectasis greater on right. Minimal pericardial effusion. Stable right adrenal nodule. Cholelithiasis without definite evidence of cholecystitis by CT, which is insensitive. Right inguinal and umbilical hernias and containing fat. Extensive edema throughout the retroperitoneum beginning inferior to pancreas extending into the presacral space; this is nonspecific and could be seen with nonspecific infection, pancreatitis, urinary tract infection. No GI source is identified. The lack of distention of the IVC and hepatic veins makes failure less  likely.  No definite subcutaneous or additional soft tissue edema seen to suggest this is slowly related to hypoproteinemia. Correlation with urinalysis and serum amylase recommended. If there is clinical suspicion of acute cholecystitis, recommend ultrasound assessment.  Findings called to Renne Crigler on 03/01/2012 at 1418 hours.   Original Report Authenticated By: Ulyses Southward, M.D.     Scheduled Meds: . hyoscyamine  0.125 mg Sublingual Q6H  . insulin aspart  0-9 Units Subcutaneous TID WC  . insulin glargine  10 Units Subcutaneous q morning - 10a  . [START ON 03/03/2012] levofloxacin (LEVAQUIN) IV  750 mg Intravenous Q24H  . simvastatin  20 mg Oral q1800  . sodium chloride  3 mL Intravenous Q12H  . sodium chloride  3 mL Intravenous Q12H   Continuous Infusions:   Principal Problem:   Abdominal pain Active Problems:   Diabetes mellitus due to underlying condition with diabetic neuropathy   Pleural effusion   SOB (shortness of breath)    Time spent: 30 min    Rhyse Skowron, [facility]  [facility] Pager [phone]. If 7PM-7AM, please contact night-coverage at [email or web address], password [facility] 03/02/2012, 7:30 PM  LOS: 1 day

## 2012-03-02 NOTE — ED Notes (Signed)
Unable to draw x2 blood cultures, will attempt again after fluids.

## 2012-03-02 NOTE — ED Notes (Signed)
CBG 246 ?

## 2012-03-03 ENCOUNTER — Inpatient Hospital Stay (HOSPITAL_COMMUNITY): Payer: Medicare Other

## 2012-03-03 ENCOUNTER — Other Ambulatory Visit (HOSPITAL_COMMUNITY): Payer: PRIVATE HEALTH INSURANCE

## 2012-03-03 DIAGNOSIS — E1169 Type 2 diabetes mellitus with other specified complication: Secondary | ICD-10-CM

## 2012-03-03 DIAGNOSIS — N179 Acute kidney failure, unspecified: Secondary | ICD-10-CM

## 2012-03-03 DIAGNOSIS — K811 Chronic cholecystitis: Secondary | ICD-10-CM

## 2012-03-03 DIAGNOSIS — I10 Essential (primary) hypertension: Secondary | ICD-10-CM

## 2012-03-03 LAB — GLUCOSE, CAPILLARY
Glucose-Capillary: 159 mg/dL — ABNORMAL HIGH (ref 70–99)
Glucose-Capillary: 179 mg/dL — ABNORMAL HIGH (ref 70–99)
Glucose-Capillary: 162 mg/dL — ABNORMAL HIGH (ref 70–99)

## 2012-03-03 LAB — BASIC METABOLIC PANEL
BUN: 10 mg/dL (ref 6–23)
Calcium: 9.2 mg/dL (ref 8.4–10.5)
GFR calc Af Amer: 85 mL/min — ABNORMAL LOW (ref >90)
GFR calc non Af Amer: 73 mL/min — ABNORMAL LOW (ref >90)
Glucose, Bld: 123 mg/dL — ABNORMAL HIGH (ref 70–99)
Potassium: 3.8 mEq/L (ref 3.5–5.1)

## 2012-03-03 LAB — CBC
Hemoglobin: 7.5 g/dL — ABNORMAL LOW (ref 12.0–15.0)
MCH: 33.9 pg (ref 26.0–34.0)
MCHC: 33.9 g/dL (ref 30.0–36.0)
MCV: 100.4 fL — ABNORMAL HIGH (ref 78.0–100.0)
Platelets: 440 10*3/uL — ABNORMAL HIGH (ref 150–400)
Platelets: 450 10*3/uL — ABNORMAL HIGH (ref 150–400)
RBC: 2.36 MIL/uL — ABNORMAL LOW (ref 3.87–5.11)
RDW: 17.2 % — ABNORMAL HIGH (ref 11.5–15.5)
RDW: 16.6 % — ABNORMAL HIGH (ref 11.5–15.5)
WBC: 7.4 10*3/uL (ref 4.0–10.5)

## 2012-03-03 LAB — SEDIMENTATION RATE: Sed Rate: 43 mm/hr — ABNORMAL HIGH (ref 0–22)

## 2012-03-03 LAB — TSH: TSH: 1.405 u[IU]/mL (ref 0.350–4.500)

## 2012-03-03 MED ORDER — SALINE SPRAY 0.65 % NA SOLN
1.0000 | NASAL | Status: DC | PRN
  Administered 2012-03-03: 1 via NASAL
  Filled 2012-03-03: qty 44

## 2012-03-03 MED ORDER — BENZONATATE 100 MG PO CAPS
100.0000 mg | ORAL_CAPSULE | Freq: Three times a day (TID) | ORAL | Status: DC
  Administered 2012-03-03 – 2012-03-07 (×12): 100 mg via ORAL
  Filled 2012-03-03 (×14): qty 1

## 2012-03-03 MED ORDER — DM-GUAIFENESIN ER 30-600 MG PO TB12
1.0000 | ORAL_TABLET | Freq: Two times a day (BID) | ORAL | Status: DC
  Administered 2012-03-03: 22:00:00 via ORAL
  Administered 2012-03-04 – 2012-03-05 (×3): 1 via ORAL
  Filled 2012-03-03 (×6): qty 1

## 2012-03-03 MED ORDER — OXYMETAZOLINE HCL 0.05 % NA SOLN
1.0000 | Freq: Two times a day (BID) | NASAL | Status: AC
  Administered 2012-03-03 – 2012-03-05 (×4): 1 via NASAL
  Filled 2012-03-03: qty 15

## 2012-03-03 MED ORDER — FUROSEMIDE 10 MG/ML IJ SOLN
40.0000 mg | Freq: Every day | INTRAMUSCULAR | Status: DC
  Administered 2012-03-04: 40 mg via INTRAVENOUS
  Filled 2012-03-03: qty 4

## 2012-03-03 MED ORDER — ONDANSETRON HCL 4 MG/2ML IJ SOLN
4.0000 mg | Freq: Four times a day (QID) | INTRAMUSCULAR | Status: DC
  Administered 2012-03-04: 4 mg via INTRAVENOUS
  Filled 2012-03-03: qty 2

## 2012-03-03 MED ORDER — PROMETHAZINE HCL 25 MG/ML IJ SOLN: 12.5000 mg | Freq: Four times a day (QID) | INTRAMUSCULAR | Status: DC | PRN

## 2012-03-03 MED ORDER — MORPHINE SULFATE 4 MG/ML IJ SOLN
2.8000 mg | Freq: Once | INTRAMUSCULAR | Status: AC
  Administered 2012-03-03: 2.8 mg via INTRAVENOUS
  Filled 2012-03-03: qty 1

## 2012-03-03 MED ORDER — TECHNETIUM TC 99M MEBROFENIN IV KIT
6.1000 | PACK | Freq: Once | INTRAVENOUS | Status: AC | PRN
  Administered 2012-03-03: 6 via INTRAVENOUS

## 2012-03-03 NOTE — ED Provider Notes (Signed)
Medical screening examination/treatment/procedure(s) were conducted as a shared visit with non-physician practitioner(s) and myself.  I personally evaluated the patient during the encounter.  Pt with abdominal pain. Mild diffuse tenderness on exam but seems to be worse in R and L lower quadrants. No guarding/rebound or distension. Appears fairly comfortable. Tolerating PO. CT with retroperitoneal edema of unclear etiology. Will tx for possible infection. I feel pt is appropriate for outpt FU at this time. Emergent return precautions discussed.  Raeford Razor, MD 03/03/12 [phone]

## 2012-03-03 NOTE — Progress Notes (Signed)
[facility] PROGRESS NOTE  Christine Silva ZOX:[id number] DOB: 15-Nov-1945 DOA: 03/01/2012 PCP: Carollee Herter, MD  Assessment/Plan  Abdominal pain - Unclear etiology, but has previously been worked up by Dr. Elnoria Howard, who referred her to [facility]. Improving except has persistent nausea.  - Continue hyoscyamine. -  Appreciate GI recommendations -  Schedule Zofran and add Phenergan prn  Chronic cholecystitis based on HIDA.  Should likely have a cholecystectomy but I am concerned about her respiratory status at this time. -  Continue gentle diuresis. -  Will consult general surgery in a few days unless patient clinically deteriorates. -  Continue to trend LFT's and WBC's.  Shortness of breath with pleural effusion.  Pneumonia less likely to cause bilateral effusions -  CTa neg for PE -  ECHO negative for diastolic and systolic heart failure  -  No proteinuria or significant hypoalbuminemia -  Appreciate Pulmonology recommendations  - TSH WNL -  Hold NSAIDS -  Continue gentle diuresis -  Wean oxygen as tolerated -  Start Mucinex and Tessalon for cough   Possible UTI, complicated by diabetes -  urine culture neg  CAP:  Possible pneumonia vs. Atelectasis of lingula -  Continue levofloxacin  Sinus congestion - Saline nasal spray prn - Afrin x 2 days  Headache, due to cough and sinus congestion - Treat cough and sinus congestion - hydrocodone/APAP prn  Diabetes mellitus2 - CBGs trending down -  Continue lantus 10 -  Continue SSI low dose  Hypertension with mostly normal BPs today -  Continue holding BP medications   Hyperlipidemia - continue home medications.  Arthritis - Continue holding NSAIDs for now. Patient is on hydrocodone for pain relief.  Hyponatremia, resolved  Acute kidney injury, creatinine trending down -  Minimize nephrotoxins -  At risk for CIN so will start some gentle diuresis tomorrow given CTa today  Anemia, macrocytic, hemoglobin stable near  8 - TSH WNL, B12 WNL, folate pending.  MM less likely as no globulin gap -  SCDs -  Occult stool was neg -  Agree with PPI  Thrombocytosis:  May be reactive from acute infection, baseline plts are high -  Consider essential thrombocytosis -  Consider JAK2  Diet:  FLD and advance as tolerated to diabetic Access:  PIV IVF:  None Proph:  SCDs  Code Status: full code Family Communication: spoke with patient alone Disposition Plan: pending improvement in SOB,    Consultants:  GI  Pulmology  Procedures:  CTa chest  CT ab/pelvis  Abd [place]  CXR  Antibiotics:  Levofloxacin 2/14 >>    HPI/Subjective: Patient states that she feels less SOB with the oxygen, but still has cough and a bad HA.  She has nausea and has not received any antiemetics.  Her LLQ abdominal discomfort has improved, denies diarrhea.    Objective: Filed Vitals:   03/02/12 2100 03/03/12 0449 03/03/12 0614 03/03/12 1330  BP: 105/51 144/67  115/51  Pulse: 94 98  86  Temp: 97.7 F (36.5 C) 98.7 F (37.1 C)  97.9 F (36.6 C)  TempSrc: Oral Oral  Oral  Resp: 20 18  26   Height:      Weight:   70.3 kg (154 lb 15.7 oz)   SpO2: 95% 98%  100%    Intake/Output Summary (Last 24 hours) at 03/03/12 2027 Last data filed at 03/03/12 2000  Gross per 24 hour  Intake   1150 ml  Output   1400 ml  Net   -250  ml   Filed Weights   03/02/12 1700 03/03/12 0614  Weight: 74.844 kg (165 lb) 70.3 kg (154 lb 15.7 oz)    Exam:   General:  Average to overweight AAF, no acute distress, nasal canula in place  HEENT:  MMM  Cardiovascular:  RRR, 1/6 murmur at the LSB, no rubs or gallops, 2+ pulses, warm extremities  Respiratory:  Diminished bilateral breath sounds to mid-back  No wheezes, some scattered rales at mid back  Abdomen:  NABS, soft, ND, TTP in RUQ without rebound or guarding   MSK:  Normal tone and bulk  Neuro:  Grossly intact  Data Reviewed: Basic Metabolic Panel:  Recent Labs Lab  03/01/12 1001 03/02/12 0110 03/02/12 0812 03/03/12 0458  NA 130* 129* 127* 135  K 4.6 4.3 4.2 3.8  CL 94* 97 94* 101  CO2 22 21 24 27   GLUCOSE 363* 240* 259* 123*  BUN 25* 20 18 10   CREATININE 1.36* 1.07 0.97 0.82  CALCIUM 9.3 8.6 8.7 9.2   Liver Function Tests:  Recent Labs Lab 03/01/12 1001 03/02/12 0110 03/02/12 0812  AST 17 14 14   ALT 9 10 10   ALKPHOS 73 62 56  BILITOT 1.3* 1.0 1.1  PROT 6.6 6.0 5.6*  ALBUMIN 3.6 3.1* 3.1*    Recent Labs Lab 03/01/12 1001 03/02/12 0812  LIPASE 27 22   No results found for this basename: AMMONIA,  in the last 168 hours CBC:  Recent Labs Lab 03/01/12 1001 03/02/12 0110 03/02/12 0812 03/03/12 0458 03/03/12 1051  WBC 13.1* 14.4* 11.6* 7.6 7.4  NEUTROABS 11.1* 10.9* 8.7*  --   --   HGB 10.3* 9.5* 8.6* 7.5* 8.0*  HCT 29.7* 26.0* 24.7* 22.1* 23.7*  MCV 101.0* 101.6* 100.8* 100.0 100.4*  PLT 649* 560* 515* 440* 450*   Cardiac Enzymes:  Recent Labs Lab 03/02/12 0110  TROPONINI <0.30   BNP (last 3 results)  Recent Labs  03/02/12 0110  PROBNP 66.3   CBG:  Recent Labs Lab 03/02/12 1301 03/02/12 1750 03/02/12 2203 03/03/12 0733 03/03/12 1116  GLUCAP 207* 169* 132* 159* 179*    Recent Results (from the past 240 hour(s))  URINE CULTURE     Status: None   Collection Time    03/01/12 10:33 AM      Result Value Range Status   Specimen Description URINE, CLEAN CATCH   Final   Special Requests NONE   Final   Culture  Setup Time 03/01/2012 15:28   Final   Colony Count NO GROWTH   Final   Culture NO GROWTH   Final   Report Status 03/02/2012 FINAL   Final  CULTURE, BLOOD (ROUTINE X 2)     Status: None   Collection Time    03/02/12  1:10 AM      Result Value Range Status   Specimen Description BLOOD BLOOD RIGHT FOREARM   Final   Special Requests BOTTLES DRAWN AEROBIC AND ANAEROBIC 4 CC EACH   Final   Culture  Setup Time 03/02/2012 13:24   Final   Culture     Final   Value:        BLOOD CULTURE RECEIVED NO  GROWTH TO DATE CULTURE WILL BE HELD FOR 5 DAYS BEFORE ISSUING A FINAL NEGATIVE REPORT   Report Status PENDING   Incomplete  CULTURE, BLOOD (ROUTINE X 2)     Status: None   Collection Time    03/02/12  2:50 AM      Result Value  Range Status   Specimen Description BLOOD LH   Final   Special Requests NONE BOTTLES DRAWN AEROBIC AND ANAEROBIC 2CC   Final   Culture  Setup Time 03/02/2012 13:25   Final   Culture     Final   Value:        BLOOD CULTURE RECEIVED NO GROWTH TO DATE CULTURE WILL BE HELD FOR 5 DAYS BEFORE ISSUING A FINAL NEGATIVE REPORT   Report Status PENDING   Incomplete     Studies: Dg Chest 2 View  03/03/2012  *RADIOLOGY REPORT*  Clinical Data: Dyspnea.  CHEST - 2 VIEW  Comparison: Chest x-ray 03/01/2012.  Findings: Lung volumes have slightly improved compared to the recent prior examination.  Decreasing bilateral pleural effusions (moderate on the left and small on the right).  Persistent bibasilar opacities may reflect atelectasis and/or consolidation in the lower lobes of the lungs bilaterally.  No evidence of pulmonary edema.  Heart size is normal.  Upper mediastinal contours are within normal limits.  Atherosclerosis in the thoracic aorta.  IMPRESSION: 1.  Improving aeration in the lung bases bilaterally compatible with decreasing bilateral pleural effusions and resolving bibasilar subsegmental atelectasis and/or air space consolidation, as above. 2.  Atherosclerosis.   Original Report Authenticated By: Trudie Reed, M.D.    Dg Chest 2 View  03/01/2012  *RADIOLOGY REPORT*  Clinical Data: Shortness of breath.  CHEST - 2 VIEW  Comparison: None.  Findings: Moderate bilateral pleural effusions are noted. Underlying pneumonia or atelectasis cannot be excluded. Cardiomediastinal silhouette appears normal.  Impression:  Moderate bilateral pleural effusions with possible underlying pneumonia or atelectasis.   Original Report Authenticated By: Lupita Raider.,  M.D.    Ct Angio Chest Pe  W/cm &/or Wo Cm  03/02/2012  *RADIOLOGY REPORT*  Clinical Data: Shortness of breath.  CT ANGIOGRAPHY CHEST  Technique:  Multidetector CT imaging of the chest using the standard protocol during bolus administration of intravenous contrast. Multiplanar reconstructed images including MIPs were obtained and reviewed to evaluate the vascular anatomy.  Contrast: 80mL OMNIPAQUE IOHEXOL 350 MG/ML SOLN  Comparison: PA and lateral chest 03/01/2012.  CT chest 08/04/2005.  Findings: No pulmonary embolus is identified.  The patient has moderate to moderately large bilateral pleural effusions.  There is infiltration of subcutaneous fat along the anterior aspect of the chest wall centrally and on the left.  No focal fluid collection in the subcutaneous tissues is identified.  There is no axillary, hilar or mediastinal lymphadenopathy.  Cardiomegaly is noted. Lungs demonstrate bilateral compressive atelectasis.  There is also some airspace disease in the lingula with some air bronchograms identified.  Scattered ground-glass attenuation is noted. Incidentally imaged upper abdomen is unremarkable.  No focal bony abnormality is identified.  IMPRESSION:  1.  Negative for pulmonary embolus. 2.  Moderate to moderately large bilateral pleural effusions with associated compressive atelectasis.  There is also some airspace disease in the lingula which could be atelectasis or pneumonia. 3.  Infiltration of subcutaneous fat of the upper anterior chest wall centrally and on the left could be due to subcutaneous edema, contusion or cellulitis.   Original Report Authenticated By: Holley Dexter, M.D.    Nm Hepatobiliary Liver Func  03/03/2012  *RADIOLOGY REPORT*  Clinical Data:  67 year old female with right upper quadrant abdominal pain and cholelithiasis identified on recent ultrasound.  NUCLEAR MEDICINE HEPATOBILIARY IMAGING  Technique:  Sequential images of the abdomen were obtained out to 60 minutes following intravenous  administration of radiopharmaceutical.  Radiopharmaceutical:  6.77mCi  Tc-29m Choletec 2.8 mg of morphine was administered intravenously during the procedure as well.  Comparison:  03/02/2012 ultrasound  Findings: Prompt homogeneous hepatic activity is identified.  There is slight increased activity in the liver adjacent to the gallbladder fossa. Small bowel activity is identified at 15 minutes.  As gallbladder activity was not identified at 60 minutes, 2.8 mg of IV morphine was administered without complication.  Gallbladder activity is identified at 5-10 minutes following morphine administration.  IMPRESSION: No evidence of cystic duct obstruction/acute cholecystitis. However, delay of gallbladder filling and possible slight hyperemia in the liver adjacent to the gallbladder suggests some degree of biliary dysfunction and possibly chronic cholecystitis.   Original Report Authenticated By: Harmon Pier, M.D.    US Abdomen Complete  03/02/2012  *RADIOLOGY REPORT*  Clinical Data:  Abdominal pain.  ABDOMINAL ULTRASOUND COMPLETE  Comparison:  CT of the abdomen and pelvis performed 03/01/2012, and abdominal ultrasound performed 07/18/2008  Findings:  Gallbladder:  Multiple small stones are seen layering dependently within the gallbladder.  These are mobile in nature.  No gallbladder wall thickening or pericholecystic fluid is seen.  No ultrasonographic Murphy's sign is elicited.  Common Bile Duct:  0.7 cm in diameter; borderline normal for the patient's age.  Liver:  Normal parenchymal echogenicity and echotexture; no focal lesions identified.  Limited Doppler evaluation demonstrates normal blood flow within the liver.  IVC:  Unremarkable in appearance.  Pancreas:  Although the pancreas is difficult to visualize in its entirety due to overlying bowel gas, no focal pancreatic abnormality is identified.  Spleen:  9.6 cm in length; within normal limits in size and echotexture.  Right kidney:  10.8 cm in length; normal in  size, configuration and parenchymal echogenicity.  No evidence of mass or hydronephrosis.  Left kidney:  13.0 cm in length; normal in size, configuration and parenchymal echogenicity.  No evidence of mass or hydronephrosis. Not well characterized due to overlying bowel gas.  Abdominal Aorta:  Normal in caliber; no aneurysm identified.  Small bilateral pleural effusions are seen, right greater than left.  IMPRESSION:  1.  No acute abnormality seen within the abdomen. 2.  Cholelithiasis again noted; gallbladder otherwise unremarkable in appearance.   Original Report Authenticated By: Tonia Ghent, M.D.     Scheduled Meds: . famotidine  20 mg Oral QHS  . furosemide  20 mg Intravenous Daily  . hyoscyamine  0.125 mg Sublingual Q6H  . insulin aspart  0-9 Units Subcutaneous TID WC  . insulin glargine  10 Units Subcutaneous q morning - 10a  . levofloxacin (LEVAQUIN) IV  750 mg Intravenous Q24H  . pantoprazole  40 mg Oral Daily  . simvastatin  20 mg Oral q1800  . sodium chloride  3 mL Intravenous Q12H  . sodium chloride  3 mL Intravenous Q12H   Continuous Infusions:   Principal Problem:   Abdominal pain Active Problems:   Diabetes mellitus due to underlying condition with diabetic neuropathy   Pleural effusion   SOB (shortness of breath)   Hyponatremia   Leukocytosis, unspecified   Macrocytic anemia   Thrombocytosis   Acute kidney injury    Time spent: 30 min    Bradey Luzier, [facility]  [facility] Pager [phone]. If 7PM-7AM, please contact night-coverage at [email or web address], password [facility] 03/03/2012, 8:27 PM  LOS: 2 days

## 2012-03-03 NOTE — Progress Notes (Signed)
Patient ID: Christine Silva, female   DOB: January 03, 1946, 67 y.o.   MRN: [id number] [facility] Progress Note  Subjective: Feeling better- notas short of breath. No real abdominal apin, no bleeding-no BM past couple days.Hungry.  Objective:  Vital signs in last 24 hours: Temp:  [97.7 F (36.5 C)-98.7 F (37.1 C)] 98.7 F (37.1 C) (02/15 0449) Pulse Rate:  [94-102] 98 (02/15 0449) Resp:  [18-20] 18 (02/15 0449) BP: (95-144)/(42-67) 144/67 mmHg (02/15 0449) SpO2:  [95 %-98 %] 98 % (02/15 0449) Weight:  [154 lb 15.7 oz (70.3 kg)-165 lb (74.844 kg)] 154 lb 15.7 oz (70.3 kg) (02/15 0614) Last BM Date: 02/28/12 General:   Alert,  Well-developed, female   in NAD Heart:  Regular rate and rhythm; no murmurs Pulm;decreased BS bilaterally Abdomen:  Soft, nontender and nondistended. Normal bowel sounds, without guarding, and without rebound.   Extremities:  Without edema. Neurologic:  Alert and  oriented x4;  grossly normal neurologically. Psych:  Alert and cooperative. Normal mood and affect.  Intake/Output from previous day: 02/14 0701 - 02/15 0700 In: 730 [P.O.:730] Out: 900 [Urine:900] Intake/Output this shift: Total I/O In: -  Out: 350 [Urine:350]  Lab Results:  Recent Labs  03/02/12 0812 03/03/12 0458 03/03/12 1051  WBC 11.6* 7.6 7.4  HGB 8.6* 7.5* 8.0*  HCT 24.7* 22.1* 23.7*  PLT 515* 440* 450*   BMET  Recent Labs  03/02/12 0110 03/02/12 0812 03/03/12 0458  NA 129* 127* 135  K 4.3 4.2 3.8  CL 97 94* 101  CO2 21 24 27   GLUCOSE 240* 259* 123*  BUN 20 18 10   CREATININE 1.07 0.97 0.82  CALCIUM 8.6 8.7 9.2   LFT  Recent Labs  03/02/12 0812  PROT 5.6*  ALBUMIN 3.1*  AST 14  ALT 10  ALKPHOS 56  BILITOT 1.1    Assessment / Plan: #1 67 yo female with large bilateral pleural effusions with dyspnea- improving with diuresis #2 retroperitoneal edema-unclear etiology-? If related to volume overload or underlying infection 34 hx of proctitis/LLQ pain-  this appears secondary to acute issues, and doubt related to the retroperitoneal edema Will advance diet Principal Problem:   Abdominal pain Active Problems:   Diabetes mellitus due to underlying condition with diabetic neuropathy   Pleural effusion   SOB (shortness of breath)   Hyponatremia   Leukocytosis, unspecified   Macrocytic anemia   Thrombocytosis   Acute kidney injury     LOS: 2 days   Amy Esterwood  03/03/2012, 11:52 AM   GI ATTENDING (covering for Dr. Elnoria Howard)  Interval history and laboratories reviewed. Patient personally seen and examined. Agree with history, physical, and assessment as outlined above. Patient has no significant lower abdominal complaints. Abdominal exam is benign. Agree with advancing diet. We'll follow.  [name]. [name]., M.D. [facility] Division of Gastroenterology

## 2012-03-03 NOTE — Progress Notes (Signed)
Patient had a short run of SVT. Vitals were: 98.63F,HR 98,RR 16,144/67. Oxygen SATS were 98% on 2 Liters Patient was asymptomatic. PCP was notified. Will continue to monitor patient.

## 2012-03-03 NOTE — Progress Notes (Signed)
CXR this am shows improvement in bilat effusions.  ESR mildly elevated, but not impressive.  TSH pending.  Would continue diuresis since she seems to be responding radiographically.  Will check again on Monday.

## 2012-03-04 DIAGNOSIS — D649 Anemia, unspecified: Secondary | ICD-10-CM

## 2012-03-04 DIAGNOSIS — R933 Abnormal findings on diagnostic imaging of other parts of digestive tract: Secondary | ICD-10-CM

## 2012-03-04 DIAGNOSIS — K5900 Constipation, unspecified: Secondary | ICD-10-CM

## 2012-03-04 DIAGNOSIS — D539 Nutritional anemia, unspecified: Secondary | ICD-10-CM

## 2012-03-04 LAB — CBC
HCT: 21.8 % — ABNORMAL LOW (ref 36.0–46.0)
Hemoglobin: 7.3 g/dL — ABNORMAL LOW (ref 12.0–15.0)
MCHC: 33.5 g/dL (ref 30.0–36.0)
MCV: 98.6 fL (ref 78.0–100.0)
RDW: 16.2 % — ABNORMAL HIGH (ref 11.5–15.5)
WBC: 7.1 10*3/uL (ref 4.0–10.5)

## 2012-03-04 LAB — COMPREHENSIVE METABOLIC PANEL
ALT: 10 U/L (ref 0–35)
Albumin: 3.2 g/dL — ABNORMAL LOW (ref 3.5–5.2)
Alkaline Phosphatase: 56 U/L (ref 39–117)
Potassium: 4.1 mEq/L (ref 3.5–5.1)
Sodium: 135 mEq/L (ref 135–145)
Total Protein: 5.8 g/dL — ABNORMAL LOW (ref 6.0–8.3)

## 2012-03-04 LAB — GLUCOSE, CAPILLARY
Glucose-Capillary: 336 mg/dL — ABNORMAL HIGH (ref 70–99)
Glucose-Capillary: 181 mg/dL — ABNORMAL HIGH (ref 70–99)
Glucose-Capillary: 254 mg/dL — ABNORMAL HIGH (ref 70–99)

## 2012-03-04 LAB — OCCULT BLOOD X 1 CARD TO LAB, STOOL: Fecal Occult Bld: NEGATIVE

## 2012-03-04 MED ORDER — BISACODYL 10 MG RE SUPP
10.0000 mg | Freq: Once | RECTAL | Status: AC
  Administered 2012-03-04: 10 mg via RECTAL
  Filled 2012-03-04: qty 1

## 2012-03-04 MED ORDER — ONDANSETRON 8 MG PO TBDP
8.0000 mg | ORAL_TABLET | ORAL | Status: DC | PRN
  Filled 2012-03-04: qty 1

## 2012-03-04 MED ORDER — FUROSEMIDE 10 MG/ML IJ SOLN
40.0000 mg | Freq: Two times a day (BID) | INTRAMUSCULAR | Status: DC
  Administered 2012-03-04 – 2012-03-06 (×4): 40 mg via INTRAVENOUS
  Filled 2012-03-04 (×5): qty 4

## 2012-03-04 NOTE — Progress Notes (Signed)
[facility] PROGRESS NOTE  Christine Silva EXB:[id number] DOB: March 25, 1945 DOA: 03/01/2012 PCP: Carollee Herter, MD  Assessment/Plan  Abdominal pain - Unclear etiology, but has previously been worked up by Dr. Elnoria Howard, who referred her to [facility]. Improving except has persistent nausea.  - Continue hyoscyamine. -  Appreciate GI recommendations -  Schedule Zofran and add Phenergan prn  Chronic cholecystitis based on HIDA.  Should likely have a cholecystectomy but I am concerned about her respiratory status at this time. -  Continue more aggressive diuresis. -  Will consult general surgery in a few days unless patient clinically deteriorates. -  Continue to trend LFT's and WBC's.  Shortness of breath with pleural effusion.  Pneumonia less likely to cause bilateral effusions -  CTa neg for PE -  ECHO negative for diastolic and systolic heart failure  -  No proteinuria or significant hypoalbuminemia -  Appreciate Pulmonology recommendations  - TSH WNL -  Hold NSAIDS -  Continue more aggressive diuresis with Lasix 40 mg IV BID -  Wean oxygen as tolerated -  Start Mucinex and Tessalon for cough   CAP:  Possible pneumonia vs. Atelectasis of lingula -  Continue levofloxacin  Sinus congestion - Saline nasal spray prn - Afrin x 2 days  Headache, due to cough and sinus congestion - Treat cough and sinus congestion - hydrocodone/APAP prn  Diabetes mellitus2 - CBGs inaccurate because taken right after meals. -  Continue lantus 10 -  Continue SSI low dose  Hypertension with mostly normal BPs today -  Continue holding BP medications   Hyperlipidemia - continue home medications.  Arthritis - Continue holding NSAIDs for now. Patient is on hydrocodone for pain relief.  Anemia, macrocytic, hemoglobin trending down slightly - TSH WNL, B12 WNL, folate pending.  MM less likely as no globulin gap -  SCDs -  Occult stool was neg -  Agree with PPI  Thrombocytosis:  May be reactive  from acute infection, baseline plts are high -  Consider essential thrombocytosis -  Consider JAK2  Diet:  FLD and advance as tolerated to diabetic Access:  PIV IVF:  None Proph:  SCDs  Code Status: full code Family Communication: spoke with patient alone Disposition Plan: pending improvement in SOB,    Consultants:  GI  Pulmology  Procedures:  CTa chest  CT ab/pelvis  Abd [place]  CXR  Antibiotics:  Levofloxacin 2/14 >>    HPI/Subjective: Patient states that she feels persistently SOB with the oxygen, and has terrible cough that prevents her from sleeping. Her HA responds to pain medication.  She has nausea.  Her LLQ abdominal discomfort has improved, denies diarrhea.    Objective: Filed Vitals:   03/03/12 1330 03/03/12 2210 03/04/12 0614 03/04/12 1351  BP: 115/51 110/52 132/52 108/49  Pulse: 86 84 89 80  Temp: 97.9 F (36.6 C) 98.5 F (36.9 C) 98.6 F (37 C) 98.2 F (36.8 C)  TempSrc: Oral Oral Oral Oral  Resp: 26 22 21 20   Height:      Weight:   73.347 kg (161 lb 11.2 oz)   SpO2: 100% 97% 99% 100%    Intake/Output Summary (Last 24 hours) at 03/04/12 2003 Last data filed at 03/04/12 1900  Gross per 24 hour  Intake   1990 ml  Output   2350 ml  Net   -360 ml   Filed Weights   03/02/12 1700 03/03/12 0614 03/04/12 0614  Weight: 74.844 kg (165 lb) 70.3 kg (154 lb 15.7 oz)  73.347 kg (161 lb 11.2 oz)    Exam:   General:  Average to overweight AAF, no acute distress, nasal canula in place  HEENT:  MMM  Cardiovascular:  RRR, 1/6 murmur at the LSB, no rubs or gallops, 2+ pulses, warm extremities  Respiratory:  Diminished bilateral breath sounds to mid-back  No wheezes, some scattered rales at mid back  Abdomen:  NABS, soft, ND, TTP in RUQ without rebound or guarding.  Stable from prior.   MSK:  Normal tone and bulk  Neuro:  Grossly intact  Data Reviewed: Basic Metabolic Panel:  Recent Labs Lab 03/01/12 1001 03/02/12 0110 03/02/12 0812  03/03/12 0458 03/04/12 0535  NA 130* 129* 127* 135 135  K 4.6 4.3 4.2 3.8 4.1  CL 94* 97 94* 101 100  CO2 22 21 24 27 28   GLUCOSE 363* 240* 259* 123* 191*  BUN 25* 20 18 10 7   CREATININE 1.36* 1.07 0.97 0.82 0.70  CALCIUM 9.3 8.6 8.7 9.2 9.0   Liver Function Tests:  Recent Labs Lab 03/01/12 1001 03/02/12 0110 03/02/12 0812 03/04/12 0535  AST 17 14 14 19   ALT 9 10 10 10   ALKPHOS 73 62 56 56  BILITOT 1.3* 1.0 1.1 2.2*  PROT 6.6 6.0 5.6* 5.8*  ALBUMIN 3.6 3.1* 3.1* 3.2*    Recent Labs Lab 03/01/12 1001 03/02/12 0812  LIPASE 27 22   No results found for this basename: AMMONIA,  in the last 168 hours CBC:  Recent Labs Lab 03/01/12 1001 03/02/12 0110 03/02/12 0812 03/03/12 0458 03/03/12 1051 03/04/12 0535  WBC 13.1* 14.4* 11.6* 7.6 7.4 7.1  NEUTROABS 11.1* 10.9* 8.7*  --   --   --   HGB 10.3* 9.5* 8.6* 7.5* 8.0* 7.3*  HCT 29.7* 26.0* 24.7* 22.1* 23.7* 21.8*  MCV 101.0* 101.6* 100.8* 100.0 100.4* 98.6  PLT 649* 560* 515* 440* 450* 415*   Cardiac Enzymes:  Recent Labs Lab 03/02/12 0110  TROPONINI <0.30   BNP (last 3 results)  Recent Labs  03/02/12 0110  PROBNP 66.3   CBG:  Recent Labs Lab 03/03/12 1719 03/03/12 2208 03/04/12 0924 03/04/12 1204 03/04/12 1756  GLUCAP 322* 162* 336* 279* 181*    Recent Results (from the past 240 hour(s))  URINE CULTURE     Status: None   Collection Time    03/01/12 10:33 AM      Result Value Range Status   Specimen Description URINE, CLEAN CATCH   Final   Special Requests NONE   Final   Culture  Setup Time 03/01/2012 15:28   Final   Colony Count NO GROWTH   Final   Culture NO GROWTH   Final   Report Status 03/02/2012 FINAL   Final  CULTURE, BLOOD (ROUTINE X 2)     Status: None   Collection Time    03/02/12  1:10 AM      Result Value Range Status   Specimen Description BLOOD BLOOD RIGHT FOREARM   Final   Special Requests BOTTLES DRAWN AEROBIC AND ANAEROBIC 4 CC EACH   Final   Culture  Setup Time  03/02/2012 13:24   Final   Culture     Final   Value:        BLOOD CULTURE RECEIVED NO GROWTH TO DATE CULTURE WILL BE HELD FOR 5 DAYS BEFORE ISSUING A FINAL NEGATIVE REPORT   Report Status PENDING   Incomplete  CULTURE, BLOOD (ROUTINE X 2)     Status: None   Collection  Time    03/02/12  2:50 AM      Result Value Range Status   Specimen Description BLOOD LH   Final   Special Requests NONE BOTTLES DRAWN AEROBIC AND ANAEROBIC 2CC   Final   Culture  Setup Time 03/02/2012 13:25   Final   Culture     Final   Value:        BLOOD CULTURE RECEIVED NO GROWTH TO DATE CULTURE WILL BE HELD FOR 5 DAYS BEFORE ISSUING A FINAL NEGATIVE REPORT   Report Status PENDING   Incomplete     Studies: Dg Chest 2 View  03/03/2012  *RADIOLOGY REPORT*  Clinical Data: Dyspnea.  CHEST - 2 VIEW  Comparison: Chest x-ray 03/01/2012.  Findings: Lung volumes have slightly improved compared to the recent prior examination.  Decreasing bilateral pleural effusions (moderate on the left and small on the right).  Persistent bibasilar opacities may reflect atelectasis and/or consolidation in the lower lobes of the lungs bilaterally.  No evidence of pulmonary edema.  Heart size is normal.  Upper mediastinal contours are within normal limits.  Atherosclerosis in the thoracic aorta.  IMPRESSION: 1.  Improving aeration in the lung bases bilaterally compatible with decreasing bilateral pleural effusions and resolving bibasilar subsegmental atelectasis and/or air space consolidation, as above. 2.  Atherosclerosis.   Original Report Authenticated By: Trudie Reed, M.D.    Nm Hepatobiliary Liver Func  03/03/2012  *RADIOLOGY REPORT*  Clinical Data:  67 year old female with right upper quadrant abdominal pain and cholelithiasis identified on recent ultrasound.  NUCLEAR MEDICINE HEPATOBILIARY IMAGING  Technique:  Sequential images of the abdomen were obtained out to 60 minutes following intravenous administration of radiopharmaceutical.   Radiopharmaceutical:  6.39mCi Tc-16m Choletec 2.8 mg of morphine was administered intravenously during the procedure as well.  Comparison:  03/02/2012 ultrasound  Findings: Prompt homogeneous hepatic activity is identified.  There is slight increased activity in the liver adjacent to the gallbladder fossa. Small bowel activity is identified at 15 minutes.  As gallbladder activity was not identified at 60 minutes, 2.8 mg of IV morphine was administered without complication.  Gallbladder activity is identified at 5-10 minutes following morphine administration.  IMPRESSION: No evidence of cystic duct obstruction/acute cholecystitis. However, delay of gallbladder filling and possible slight hyperemia in the liver adjacent to the gallbladder suggests some degree of biliary dysfunction and possibly chronic cholecystitis.   Original Report Authenticated By: Harmon Pier, M.D.     Scheduled Meds: . benzonatate  100 mg Oral TID  . dextromethorphan-guaiFENesin  1 tablet Oral BID  . famotidine  20 mg Oral QHS  . furosemide  40 mg Intravenous Q12H  . hyoscyamine  0.125 mg Sublingual Q6H  . insulin aspart  0-9 Units Subcutaneous TID WC  . insulin glargine  10 Units Subcutaneous q morning - 10a  . levofloxacin (LEVAQUIN) IV  750 mg Intravenous Q24H  . oxymetazoline  1 spray Each Nare BID  . pantoprazole  40 mg Oral Daily  . simvastatin  20 mg Oral q1800  . sodium chloride  3 mL Intravenous Q12H  . sodium chloride  3 mL Intravenous Q12H   Continuous Infusions:   Principal Problem:   Abdominal pain Active Problems:   Diabetes mellitus due to underlying condition with diabetic neuropathy   Pleural effusion   SOB (shortness of breath)   Hyponatremia   Leukocytosis, unspecified   Macrocytic anemia   Thrombocytosis   Acute kidney injury   Chronic cholecystitis    Time spent: 30  min    Jacqui Headen, [facility]  [facility] Pager [phone]. If 7PM-7AM, please contact night-coverage at [email or web address],  password [facility] 03/04/2012, 8:03 PM  LOS: 3 days

## 2012-03-04 NOTE — Progress Notes (Signed)
Patient ID: Christine Silva, female   DOB: 08-Mar-1945, 67 y.o.   MRN: [id number] [facility] Progress Note  Subjective: C/o increased coughing, and SOB today. No c/o abdominal discomfort-no BM  Objective:  Vital signs in last 24 hours: Temp:  [97.9 F (36.6 C)-98.6 F (37 C)] 98.6 F (37 C) (02/16 1478) Pulse Rate:  [84-89] 89 (02/16 0614) Resp:  [21-26] 21 (02/16 0614) BP: (110-132)/(51-52) 132/52 mmHg (02/16 0614) SpO2:  [97 %-100 %] 99 % (02/16 0614) Weight:  [161 lb 11.2 oz (73.347 kg)] 161 lb 11.2 oz (73.347 kg) (02/16 0614) Last BM Date: 02/28/12 General:   Alert,  Well-developed,AA female    in NAD Heart:  Regular rate and rhythm; no murmurs Pulm;decreased BS Bases Abdomen:  Soft, nontender and nondistended. Normal bowel sounds, without guarding, and without rebound.   Extremities:  Without edema. Neurologic:  Alert and  oriented x4;  grossly normal neurologically. Psych:  Alert and cooperative. Normal mood and affect.  Intake/Output from previous day: 02/15 0701 - 02/16 0700 In: 1930 [P.O.:1830; IV Piggyback:100] Out: 1050 [Urine:1050] Intake/Output this shift: Total I/O In: 360 [P.O.:360] Out: 150 [Urine:150]  Lab Results:  Recent Labs  03/03/12 0458 03/03/12 1051 03/04/12 0535  WBC 7.6 7.4 7.1  HGB 7.5* 8.0* 7.3*  HCT 22.1* 23.7* 21.8*  PLT 440* 450* 415*   BMET  Recent Labs  03/02/12 0812 03/03/12 0458 03/04/12 0535  NA 127* 135 135  K 4.2 3.8 4.1  CL 94* 101 100  CO2 24 27 28   GLUCOSE 259* 123* 191*  BUN 18 10 7   CREATININE 0.97 0.82 0.70  CALCIUM 8.7 9.2 9.0   LFT  Recent Labs  03/04/12 0535  PROT 5.8*  ALBUMIN 3.2*  AST 19  ALT 10  ALKPHOS 56  BILITOT 2.2*     Assessment / Plan: #1  67 yo female with hx of proctitis -no current GI c/o-will add dulcolax supp for constipation #2 Retroperitoneal edema-likely related to same process causing large effusions #3 Delayed GB filling- hard to interpret in current setting-I  do not think she has any evidence of acute cholecystitis- and would not pursue now given pulmonary issues. #4 chronic anemia Dr. Elnoria Howard will return tomorrow Principal Problem:   Abdominal pain Active Problems:   Diabetes mellitus due to underlying condition with diabetic neuropathy   Pleural effusion   SOB (shortness of breath)   Hyponatremia   Leukocytosis, unspecified   Macrocytic anemia   Thrombocytosis   Acute kidney injury   Chronic cholecystitis     LOS: 3 days   Amy Esterwood  03/04/2012, 11:25 AM   GI ATTENDING (for Dr. Elnoria Howard)  Interval history and laboratories reviewed. Patient seen and examined. Agree with history, physical, assessment, and plan as outlined above. No acute GI issues. Agree with Dulcolax for constipation. Abdominal exam entirely negative. Will sign off. If further GI assistance needed, please contact Dr. Elnoria Howard. Thank you  [name]. [name]., M.D. [facility] Division of Gastroenterology

## 2012-03-04 NOTE — Progress Notes (Signed)
Clinical Social Work Department BRIEF PSYCHOSOCIAL ASSESSMENT 03/04/2012  Patient:  Christine Silva, Christine Silva     Account Number:  [id number]     Admit date:  03/01/2012  Clinical Social Worker:  Leron Croak, CLINICAL SOCIAL WORKER  Date/Time:  03/04/2012 12:00 M  Referred by:  Physician  Date Referred:  03/03/2012 Referred for  Other - See comment   Other Referral:   Pt needed financial resources   Interview type:  Patient Other interview type:    PSYCHOSOCIAL DATA Living Status:  WITH ADULT CHILDREN Admitted from facility:   Level of care:   Primary support name:  Brett Canales Content Primary support relationship to patient:  CHILD, ADULT Degree of support available:   Pt has some support in place with family.    CURRENT CONCERNS Current Concerns  Financial Resources   Other Concerns:    SOCIAL WORK ASSESSMENT / PLAN CSW met with the pt to discuss financial needs. Pt was not very specific. Pt stated that she would just like some resources and that she was more concerned about getting her mediication paid.   Assessment/plan status:  Information/Referral to [facility] Other assessment/ plan:   Information/referral to community resources:   CSW provided the pt with financial resources in the local area and also referred the pt to CM for assistance with medication assistance    PATIENT'S/FAMILY'S RESPONSE TO PLAN OF CARE: Pt was appreciative for assistance with financial resources and referral to CM.       Leron Croak, LCSWA [employer] [phone]

## 2012-03-04 NOTE — Progress Notes (Signed)
03-04-12  NSG:  Pt has orders for scheduled Zofran and Levsin.  Pt has denies nausea and abd discomfort for at least the last 8 hours.  Did complain of headache at 2000 which was resolved with pain medication.  Abd is soft, non tender c + flatus and bs.  Has been tolerating her full liquid diet today and desires to increase to soft ADA diet as ordered.  Will advance diet for breakfast.  Pt asked not to be awaken at midnight for Levsin and zofran.  States she does not want those meds as scheduled and states she will ask for them PRN instead.

## 2012-03-05 DIAGNOSIS — M129 Arthropathy, unspecified: Secondary | ICD-10-CM

## 2012-03-05 LAB — COMPREHENSIVE METABOLIC PANEL
Albumin: 3.1 g/dL — ABNORMAL LOW (ref 3.5–5.2)
Alkaline Phosphatase: 55 U/L (ref 39–117)
BUN: 12 mg/dL (ref 6–23)
Chloride: 96 mEq/L (ref 96–112)
Creatinine, Ser: 0.79 mg/dL (ref 0.50–1.10)
GFR calc Af Amer: >90 mL/min (ref >90)
GFR calc non Af Amer: 85 mL/min — ABNORMAL LOW (ref >90)
Glucose, Bld: 268 mg/dL — ABNORMAL HIGH (ref 70–99)
Total Bilirubin: 1.8 mg/dL — ABNORMAL HIGH (ref 0.3–1.2)

## 2012-03-05 LAB — GLUCOSE, CAPILLARY
Glucose-Capillary: 222 mg/dL — ABNORMAL HIGH (ref 70–99)
Glucose-Capillary: 191 mg/dL — ABNORMAL HIGH (ref 70–99)

## 2012-03-05 LAB — CBC
MCV: 99 fL (ref 78.0–100.0)
Platelets: 390 10*3/uL (ref 150–400)
RBC: 2.01 MIL/uL — ABNORMAL LOW (ref 3.87–5.11)
RDW: 16.2 % — ABNORMAL HIGH (ref 11.5–15.5)
WBC: 8.3 10*3/uL (ref 4.0–10.5)

## 2012-03-05 MED ORDER — LEVOFLOXACIN 750 MG PO TABS
750.0000 mg | ORAL_TABLET | Freq: Every day | ORAL | Status: DC
  Administered 2012-03-06 – 2012-03-07 (×2): 750 mg via ORAL
  Filled 2012-03-05 (×2): qty 1

## 2012-03-05 MED ORDER — GUAIFENESIN-CODEINE 100-10 MG/5ML PO SOLN
5.0000 mL | ORAL | Status: DC | PRN
  Administered 2012-03-05 – 2012-03-06 (×2): 5 mL via ORAL
  Filled 2012-03-05 (×2): qty 5

## 2012-03-05 MED ORDER — INSULIN GLARGINE 100 UNIT/ML ~~LOC~~ SOLN
15.0000 [IU] | Freq: Every morning | SUBCUTANEOUS | Status: DC
  Administered 2012-03-06: 15 [IU] via SUBCUTANEOUS

## 2012-03-05 MED ORDER — INSULIN ASPART 100 UNIT/ML ~~LOC~~ SOLN
0.0000 [IU] | Freq: Every day | SUBCUTANEOUS | Status: DC
  Administered 2012-03-05: 2 [IU] via SUBCUTANEOUS

## 2012-03-05 MED ORDER — INSULIN ASPART 100 UNIT/ML ~~LOC~~ SOLN
0.0000 [IU] | Freq: Three times a day (TID) | SUBCUTANEOUS | Status: DC
Start: 2012-03-06 — End: 2012-03-07
  Administered 2012-03-06: 5 [IU] via SUBCUTANEOUS
  Administered 2012-03-06: 3 [IU] via SUBCUTANEOUS
  Administered 2012-03-06: 8 [IU] via SUBCUTANEOUS
  Administered 2012-03-07: 5 [IU] via SUBCUTANEOUS
  Administered 2012-03-07: 8 [IU] via SUBCUTANEOUS
  Administered 2012-03-07: 2 [IU] via SUBCUTANEOUS

## 2012-03-05 NOTE — Progress Notes (Signed)
Subjective: No further abdominal pain.  Feeling much better from the GI standpoint, but still with some SOB and coughing.  Objective: Vital signs in last 24 hours: Temp:  [97.9 F (36.6 C)-98.4 F (36.9 C)] 98.4 F (36.9 C) (02/17 0522) Pulse Rate:  [77-88] 77 (02/17 0522) Resp:  [20-22] 20 (02/17 0522) BP: (101-123)/(40-64) 101/40 mmHg (02/17 0522) SpO2:  [98 %-100 %] 98 % (02/17 0522) Weight:  [157 lb 13.6 oz (71.6 kg)] 157 lb 13.6 oz (71.6 kg) (02/17 0522) Last BM Date: 02/28/12  Intake/Output from previous day: 02/16 0701 - 02/17 0700 In: 1560 [P.O.:1560] Out: 3401 [Urine:3400; Stool:1] Intake/Output this shift:    General appearance: alert and no distress GI: soft, non-tender; bowel sounds normal; no masses,  no organomegaly  Lab Results:  Recent Labs  03/03/12 1051 03/04/12 0535 03/05/12 0430  WBC 7.4 7.1 8.3  HGB 8.0* 7.3* 7.0*  HCT 23.7* 21.8* 19.9*  PLT 450* 415* 390   BMET  Recent Labs  03/03/12 0458 03/04/12 0535 03/05/12 0430  NA 135 135 133*  K 3.8 4.1 3.7  CL 101 100 96  CO2 27 28 32  GLUCOSE 123* 191* 268*  BUN 10 7 12   CREATININE 0.82 0.70 0.79  CALCIUM 9.2 9.0 8.8   LFT  Recent Labs  03/05/12 0430  PROT 5.5*  ALBUMIN 3.1*  AST 17  ALT 11  ALKPHOS 55  BILITOT 1.8*   PT/INR No results found for this basename: LABPROT, INR,  in the last 72 hours Hepatitis Panel No results found for this basename: HEPBSAG, HCVAB, HEPAIGM, HEPBIGM,  in the last 72 hours C-Diff No results found for this basename: CDIFFTOX,  in the last 72 hours Fecal Lactopherrin No results found for this basename: FECLLACTOFRN,  in the last 72 hours  Studies/Results: Dg Chest 2 View  03/03/2012  *RADIOLOGY REPORT*  Clinical Data: Dyspnea.  CHEST - 2 VIEW  Comparison: Chest x-ray 03/01/2012.  Findings: Lung volumes have slightly improved compared to the recent prior examination.  Decreasing bilateral pleural effusions (moderate on the left and small on the  right).  Persistent bibasilar opacities may reflect atelectasis and/or consolidation in the lower lobes of the lungs bilaterally.  No evidence of pulmonary edema.  Heart size is normal.  Upper mediastinal contours are within normal limits.  Atherosclerosis in the thoracic aorta.  IMPRESSION: 1.  Improving aeration in the lung bases bilaterally compatible with decreasing bilateral pleural effusions and resolving bibasilar subsegmental atelectasis and/or air space consolidation, as above. 2.  Atherosclerosis.   Original Report Authenticated By: Trudie Reed, M.D.    Nm Hepatobiliary Liver Func  03/03/2012  *RADIOLOGY REPORT*  Clinical Data:  67 year old female with right upper quadrant abdominal pain and cholelithiasis identified on recent ultrasound.  NUCLEAR MEDICINE HEPATOBILIARY IMAGING  Technique:  Sequential images of the abdomen were obtained out to 60 minutes following intravenous administration of radiopharmaceutical.  Radiopharmaceutical:  6.76mCi Tc-27m Choletec 2.8 mg of morphine was administered intravenously during the procedure as well.  Comparison:  03/02/2012 ultrasound  Findings: Prompt homogeneous hepatic activity is identified.  There is slight increased activity in the liver adjacent to the gallbladder fossa. Small bowel activity is identified at 15 minutes.  As gallbladder activity was not identified at 60 minutes, 2.8 mg of IV morphine was administered without complication.  Gallbladder activity is identified at 5-10 minutes following morphine administration.  IMPRESSION: No evidence of cystic duct obstruction/acute cholecystitis. However, delay of gallbladder filling and possible slight hyperemia in the  liver adjacent to the gallbladder suggests some degree of biliary dysfunction and possibly chronic cholecystitis.   Original Report Authenticated By: Harmon Pier, M.D.     Medications:  Scheduled: . benzonatate  100 mg Oral TID  . dextromethorphan-guaiFENesin  1 tablet Oral BID  .  famotidine  20 mg Oral QHS  . furosemide  40 mg Intravenous Q12H  . hyoscyamine  0.125 mg Sublingual Q6H  . insulin aspart  0-9 Units Subcutaneous TID WC  . insulin glargine  10 Units Subcutaneous q morning - 10a  . levofloxacin (LEVAQUIN) IV  750 mg Intravenous Q24H  . oxymetazoline  1 spray Each Nare BID  . pantoprazole  40 mg Oral Daily  . simvastatin  20 mg Oral q1800  . sodium chloride  3 mL Intravenous Q12H  . sodium chloride  3 mL Intravenous Q12H   Continuous:   Assessment/Plan: 1) Chronic intermittent LLQ pain of unclear etiology. 2) SOB.   She is much better than when I first saw her on Friday.  She again reports the benefit of hyoscyamine, however, when she coughs she will have some of the lower abdominal pain.  That pain appears to be muscular in origin.   Plan: 1) Continue with hyoscyamine.  I will order the medication for her and send it to her [facility] on [address]. 2) Continue work up/treatment for her SOB per Hospitalist.   3) Signing off.  LOS: 4 days   Ramil Edgington D 03/05/2012, 7:28 AM

## 2012-03-05 NOTE — Progress Notes (Addendum)
[facility] PROGRESS NOTE  Christine Silva WGN:[id number] DOB: 01-26-1945 DOA: 03/01/2012 PCP: Carollee Herter, MD  Assessment/Plan  Abdominal pain - Unclear etiology, but has previously been worked up by Dr. Elnoria Howard, who referred her to [facility]. Improving except has persistent nausea.  - Continue hyoscyamine. -  Appreciate GI recommendations -  Schedule Zofran and add Phenergan prn  Chronic cholecystitis based on HIDA.  Should likely have a cholecystectomy but I am concerned about her respiratory status at this time. -  Continue diuresis. -  Will consult general surgery in a few days unless patient clinically deteriorates. -  Continue to trend LFT's and WBC's.  Shortness of breath with pleural effusion.  Pneumonia less likely to cause bilateral effusions.  Neg 1.8L yesterday -  CTa neg for PE -  ECHO negative for diastolic and systolic heart failure  -  No proteinuria or significant hypoalbuminemia  - TSH WNL -  Hold NSAIDS -  Continue  Lasix 40 mg IV BID, goal -1 to 2 L daily -  Wean oxygen as tolerated -  D/c Mucinex and start guaifenesin with codeine -  Continue Tessalon   -  Appreciate Pulmonology recommendations:  Repeat CXR in AM   CAP:  Possible pneumonia vs. Atelectasis of lingula -  Continue levofloxacin day 4/7  Sinus congestion - Saline nasal spray prn - Afrin x 2 days  Headache, due to cough and sinus congestion - Treat cough and sinus congestion - hydrocodone/APAP prn  Diabetes mellitus2 - CBGs elevated -  Increase to lantus 15 -  Increase to SSI mod dose  Hypertension/HLD with mostly normal BPs today -  Continue holding BP medications  - continue chol meds.  Arthritis - Continue holding NSAIDs for now. Patient is on hydrocodone for pain relief.  Anemia, macrocytic, hemoglobin trending down slightly - TSH WNL, B12 WNL, folate pending.  MM less likely as no globulin gap -  Occult stool was neg x 2  -  Tx for hemoglobin < 7 with additional lasix  (likely tomorrow)  Thrombocytosis:  May be reactive from acute infection, baseline plts are high -  Consider essential thrombocytosis -  Consider JAK2  Diet:  diabetic Access:  PIV IVF:  None Proph:  SCDs  Code Status: full code Family Communication: spoke with patient alone Disposition Plan: pending improvement in SOB   Consultants:  GI  Pulmology  Procedures:  CTa chest  CT ab/pelvis  Abd [place]  CXR  Antibiotics:  Levofloxacin 2/14 >>    HPI/Subjective: Patient states that she feels better, but has terrible cough that prevents her from sleeping and was severe today. Her HA responds to pain medication.  She has nausea.  Her LLQ abdominal discomfort has improved, denies diarrhea.    Objective: Filed Vitals:   03/04/12 1351 03/04/12 2128 03/05/12 0522 03/05/12 1431  BP: 108/49 123/64 101/40 124/50  Pulse: 80 88 77 79  Temp: 98.2 F (36.8 C) 97.9 F (36.6 C) 98.4 F (36.9 C) 98.2 F (36.8 C)  TempSrc: Oral Oral Oral Oral  Resp: 20 22 20 20   Height:      Weight:   71.6 kg (157 lb 13.6 oz)   SpO2: 100% 100% 98% 100%    Intake/Output Summary (Last 24 hours) at 03/05/12 2039 Last data filed at 03/05/12 1432  Gross per 24 hour  Intake   1080 ml  Output   2751 ml  Net  -1671 ml   Filed Weights   03/03/12 0614 03/04/12 0614 03/05/12  0522  Weight: 70.3 kg (154 lb 15.7 oz) 73.347 kg (161 lb 11.2 oz) 71.6 kg (157 lb 13.6 oz)    Exam:   General:  Average to overweight AAF, no acute distress, nasal canula in place, frequent severe chesty cough  HEENT:  MMM  Cardiovascular:  RRR, 1/6 murmur at the LSB, no rubs or gallops, 2+ pulses, warm extremities  Respiratory:  Diminished bilateral breath sounds at bases with rales at mid back, no rhonchi  Abdomen:  NABS, soft, ND, TTP in RUQ without rebound or guarding.  Stable from prior.   MSK:  Normal tone and bulk  Neuro:  Grossly intact  Data Reviewed: Basic Metabolic Panel:  Recent Labs Lab  03/02/12 0110 03/02/12 0812 03/03/12 0458 03/04/12 0535 03/05/12 0430  NA 129* 127* 135 135 133*  K 4.3 4.2 3.8 4.1 3.7  CL 97 94* 101 100 96  CO2 21 24 27 28  32  GLUCOSE 240* 259* 123* 191* 268*  BUN 20 18 10 7 12   CREATININE 1.07 0.97 0.82 0.70 0.79  CALCIUM 8.6 8.7 9.2 9.0 8.8   Liver Function Tests:  Recent Labs Lab 03/01/12 1001 03/02/12 0110 03/02/12 0812 03/04/12 0535 03/05/12 0430  AST 17 14 14 19 17   ALT 9 10 10 10 11   ALKPHOS 73 62 56 56 55  BILITOT 1.3* 1.0 1.1 2.2* 1.8*  PROT 6.6 6.0 5.6* 5.8* 5.5*  ALBUMIN 3.6 3.1* 3.1* 3.2* 3.1*    Recent Labs Lab 03/01/12 1001 03/02/12 0812  LIPASE 27 22   No results found for this basename: AMMONIA,  in the last 168 hours CBC:  Recent Labs Lab 03/01/12 1001 03/02/12 0110 03/02/12 0812 03/03/12 0458 03/03/12 1051 03/04/12 0535 03/05/12 0430  WBC 13.1* 14.4* 11.6* 7.6 7.4 7.1 8.3  NEUTROABS 11.1* 10.9* 8.7*  --   --   --   --   HGB 10.3* 9.5* 8.6* 7.5* 8.0* 7.3* 7.0*  HCT 29.7* 26.0* 24.7* 22.1* 23.7* 21.8* 19.9*  MCV 101.0* 101.6* 100.8* 100.0 100.4* 98.6 99.0  PLT 649* 560* 515* 440* 450* 415* 390   Cardiac Enzymes:  Recent Labs Lab 03/02/12 0110  TROPONINI <0.30   BNP (last 3 results)  Recent Labs  03/02/12 0110  PROBNP 66.3   CBG:  Recent Labs Lab 03/04/12 1756 03/04/12 2126 03/05/12 0747 03/05/12 1208 03/05/12 1641  GLUCAP 181* 254* 222* 191* 231*    Recent Results (from the past 240 hour(s))  URINE CULTURE     Status: None   Collection Time    03/01/12 10:33 AM      Result Value Range Status   Specimen Description URINE, CLEAN CATCH   Final   Special Requests NONE   Final   Culture  Setup Time 03/01/2012 15:28   Final   Colony Count NO GROWTH   Final   Culture NO GROWTH   Final   Report Status 03/02/2012 FINAL   Final  CULTURE, BLOOD (ROUTINE X 2)     Status: None   Collection Time    03/02/12  1:10 AM      Result Value Range Status   Specimen Description BLOOD BLOOD  RIGHT FOREARM   Final   Special Requests BOTTLES DRAWN AEROBIC AND ANAEROBIC 4 CC EACH   Final   Culture  Setup Time 03/02/2012 13:24   Final   Culture     Final   Value:        BLOOD CULTURE RECEIVED NO GROWTH TO DATE CULTURE  WILL BE HELD FOR 5 DAYS BEFORE ISSUING A FINAL NEGATIVE REPORT   Report Status PENDING   Incomplete  CULTURE, BLOOD (ROUTINE X 2)     Status: None   Collection Time    03/02/12  2:50 AM      Result Value Range Status   Specimen Description BLOOD LH   Final   Special Requests NONE BOTTLES DRAWN AEROBIC AND ANAEROBIC 2CC   Final   Culture  Setup Time 03/02/2012 13:25   Final   Culture     Final   Value:        BLOOD CULTURE RECEIVED NO GROWTH TO DATE CULTURE WILL BE HELD FOR 5 DAYS BEFORE ISSUING A FINAL NEGATIVE REPORT   Report Status PENDING   Incomplete     Studies: No results found.  Scheduled Meds: . benzonatate  100 mg Oral TID  . dextromethorphan-guaiFENesin  1 tablet Oral BID  . famotidine  20 mg Oral QHS  . furosemide  40 mg Intravenous Q12H  . hyoscyamine  0.125 mg Sublingual Q6H  . [START ON 03/06/2012] insulin aspart  0-15 Units Subcutaneous TID WC  . insulin aspart  0-5 Units Subcutaneous QHS  . [START ON 03/06/2012] insulin glargine  15 Units Subcutaneous q morning - 10a  . [START ON 03/06/2012] levofloxacin  750 mg Oral Daily  . pantoprazole  40 mg Oral Daily  . simvastatin  20 mg Oral q1800  . sodium chloride  3 mL Intravenous Q12H  . sodium chloride  3 mL Intravenous Q12H   Continuous Infusions:   Principal Problem:   Abdominal pain Active Problems:   Diabetes mellitus due to underlying condition with diabetic neuropathy   Pleural effusion   SOB (shortness of breath)   Hyponatremia   Leukocytosis, unspecified   Macrocytic anemia   Thrombocytosis   Acute kidney injury   Chronic cholecystitis    Time spent: 30 min    Aeryn Medici, [facility]  [facility] Pager [phone]. If 7PM-7AM, please contact night-coverage at  [email or web address], password [facility] 03/05/2012, 8:39 PM  LOS: 4 days

## 2012-03-05 NOTE — Progress Notes (Signed)
ANTIBIOTIC CONSULT NOTE - FOLLOW UP  Pharmacy Consult for Levaquin Indication: CAP  No Known Allergies  Patient Measurements: Height: 5\' 4"  (162.6 cm) Weight: 157 lb 13.6 oz (71.6 kg) IBW/kg (Calculated) : 54.7  Vital Signs: Temp: 98.4 F (36.9 C) (02/17 0522) Temp src: Oral (02/17 0522) BP: 101/40 mmHg (02/17 0522) Pulse Rate: 77 (02/17 0522) Intake/Output from previous day: 02/16 0701 - 02/17 0700 In: 1560 [P.O.:1560] Out: 3401 [Urine:3400; Stool:1] Intake/Output from this shift:    Labs:  Recent Labs  03/03/12 0458 03/03/12 1051 03/04/12 0535 03/05/12 0430  WBC 7.6 7.4 7.1 8.3  HGB 7.5* 8.0* 7.3* 7.0*  PLT 440* 450* 415* 390  CREATININE 0.82  --  0.70 0.79   Estimated Creatinine Clearance: 67.2 ml/min (by C-G formula based on Cr of 0.79).   Microbiology: Recent Results (from the past 720 hour(s))  CULTURE, BLOOD (ROUTINE X 2)     Status: None   Collection Time    03/02/12  1:10 AM      Result Value Range Status   Specimen Description BLOOD BLOOD RIGHT FOREARM   Final   Special Requests BOTTLES DRAWN AEROBIC AND ANAEROBIC 4 CC EACH   Final   Culture  Setup Time 03/02/2012 13:24   Final   Culture     Final   Value:        BLOOD CULTURE RECEIVED NO GROWTH TO DATE CULTURE WILL BE HELD FOR 5 DAYS BEFORE ISSUING A FINAL NEGATIVE REPORT   Report Status PENDING   Incomplete  CULTURE, BLOOD (ROUTINE X 2)     Status: None   Collection Time    03/02/12  2:50 AM      Result Value Range Status   Specimen Description BLOOD LH   Final   Special Requests NONE BOTTLES DRAWN AEROBIC AND ANAEROBIC 2CC   Final   Culture  Setup Time 03/02/2012 13:25   Final   Culture     Final   Value:        BLOOD CULTURE RECEIVED NO GROWTH TO DATE CULTURE WILL BE HELD FOR 5 DAYS BEFORE ISSUING A FINAL NEGATIVE REPORT   Report Status PENDING   Incomplete    Anti-infectives   Start     Dose/Rate Route Frequency Ordered Stop   03/03/12 0500  levofloxacin (LEVAQUIN) IVPB 750 mg     750  mg 100 mL/hr over 90 Minutes Intravenous Every 24 hours 03/02/12 1449     03/02/12 0100  levofloxacin (LEVAQUIN) IVPB 750 mg     750 mg 100 mL/hr over 90 Minutes Intravenous  Once 03/02/12 0049 03/02/12 0804      Assessment:  67 yo F presents 2/13 with CC: abd pain and SOB  Day #4 Levaquin 750mg  IV q24h for presumed CAP  Afeb, WBC normal, SCr stable, cultures negative to date   Goal of Therapy:  Eradication of infection, dose per renal function  Plan:   Continue Levaquin 750mg  q24h - change to PO   Pharmacy will sign-off as renal function is stable; please re-consult if needed  Loralee Pacas, PharmD, BCPS Pager: [phone] 03/05/2012,8:22 AM

## 2012-03-06 ENCOUNTER — Inpatient Hospital Stay (HOSPITAL_COMMUNITY): Payer: Medicare Other

## 2012-03-06 DIAGNOSIS — E785 Hyperlipidemia, unspecified: Secondary | ICD-10-CM

## 2012-03-06 LAB — COMPREHENSIVE METABOLIC PANEL
ALT: 16 U/L (ref 0–35)
AST: 28 U/L (ref 0–37)
Albumin: 3.5 g/dL (ref 3.5–5.2)
Alkaline Phosphatase: 62 U/L (ref 39–117)
Potassium: 3.3 mEq/L — ABNORMAL LOW (ref 3.5–5.1)
Sodium: 137 mEq/L (ref 135–145)
Total Protein: 6.2 g/dL (ref 6.0–8.3)

## 2012-03-06 LAB — CBC
HCT: 22.7 % — ABNORMAL LOW (ref 36.0–46.0)
MCHC: 34.4 g/dL (ref 30.0–36.0)
MCV: 97.8 fL (ref 78.0–100.0)
RDW: 16.6 % — ABNORMAL HIGH (ref 11.5–15.5)

## 2012-03-06 LAB — GLUCOSE, CAPILLARY
Glucose-Capillary: 288 mg/dL — ABNORMAL HIGH (ref 70–99)
Glucose-Capillary: 225 mg/dL — ABNORMAL HIGH (ref 70–99)

## 2012-03-06 MED ORDER — POTASSIUM CHLORIDE CRYS ER 20 MEQ PO TBCR
40.0000 meq | EXTENDED_RELEASE_TABLET | Freq: Once | ORAL | Status: AC
  Administered 2012-03-06: 40 meq via ORAL
  Filled 2012-03-06: qty 2

## 2012-03-06 MED ORDER — INSULIN GLARGINE 100 UNIT/ML ~~LOC~~ SOLN
20.0000 [IU] | Freq: Every morning | SUBCUTANEOUS | Status: DC
  Administered 2012-03-07: 20 [IU] via SUBCUTANEOUS

## 2012-03-06 MED ORDER — FUROSEMIDE 20 MG PO TABS
20.0000 mg | ORAL_TABLET | Freq: Every day | ORAL | Status: DC
  Administered 2012-03-07: 20 mg via ORAL
  Filled 2012-03-06: qty 1

## 2012-03-06 NOTE — Progress Notes (Signed)
PULMONARY  / CRITICAL CARE MEDICINE  Name: Christine Silva MRN: [id number] DOB: 04-04-1945    ADMISSION DATE:  03/01/2012 CONSULTATION DATE: 2/14  REFERRING MD :  [facility]  CHIEF COMPLAINT: Pleural effusion  BRIEF PATIENT DESCRIPTION:  67 yo female who was admitted with abd pain 2-13. Notable for increased SOB(with any activity) and non productive cough, no fever , chills or sweats. Recent UTI is noted. CxR  and CT chest reveal large bilateral pleural effusion and PCCM consulted.  SIGNIFICANT EVENTS / STUDIES:    LINES / TUBES:   CULTURES: 2/14 bc x 2>>  ANTIBIOTICS: 2/14 levaquin>>  HISTORY OF PRESENT ILLNESS:   37 yobf retired Investment banker, operational with h/o smoking but no prior resp problems admit through ER AM 2/14 with abd pain and sob with minimal activity x 4 days assoc with nausea but no vomiting or diarrhea or fever. Min dry cough.  Very difficult historian not really clear when she was last well or how many different arthritis meds she takes, speaks [place] as her native language. At baseline sleeping ok without nocturnal  or early am exacerbation  of respiratory  c/o's or need for noct saba. Also denies any obvious fluctuation of symptoms with weather or environmental changes or other aggravating or alleviating factors except as outlined above    SUBJECTIVE:  NAD at rest. VITAL SIGNS: Temp:  [98.2 F (36.8 C)-98.5 F (36.9 C)] 98.5 F (36.9 C) (02/18 0551) Pulse Rate:  [71-80] 71 (02/18 0551) Resp:  [19-20] 19 (02/18 0551) BP: (94-124)/(48-58) 105/49 mmHg (02/18 0600) SpO2:  [100 %] 100 % (02/18 0551) Weight:  [69.9 kg (154 lb 1.6 oz)] 69.9 kg (154 lb 1.6 oz) (02/18 0551) 02 rx =  2lpm  PHYSICAL EXAMINATION: General:  WNWDAAFNAD@rest  Neuro:  Intact HEENT:  No LAN, oral mucosa unremarkable. Neck:  No jvd Cardiovascular:  HSR RRR Lungs:  Decreased in bases. Dull to percussion bases Abdomen:  Tender without guarding, +bs Musculoskeletal:  intact Skin:  warm   Recent Labs Lab  03/04/12 0535 03/05/12 0430 03/06/12 0500  NA 135 133* 137  K 4.1 3.7 3.3*  CL 100 96 95*  CO2 28 32 34*  BUN 7 12 14   CREATININE 0.70 0.79 0.76  GLUCOSE 191* 268* 176*    Recent Labs Lab 03/04/12 0535 03/05/12 0430 03/06/12 0500  HGB 7.3* 7.0* 7.8*  HCT 21.8* 19.9* 22.7*  WBC 7.1 8.3 9.0  PLT 415* 390 427*   No results found.  ASSESSMENT / PLAN: Bilateral pleural effusions ? etiology with 4 days of increasing dyspnea with  abd process (?infective). No lower ext edema.ECHO normal. TSH nml.  Rec F/u cxr pending   2) Anemia ? Acute blood loss > d/c hep/ rx ppi and recheck cbc 2/15

## 2012-03-06 NOTE — Evaluation (Signed)
Occupational Therapy Evaluation Patient Details Name: Christine Silva MRN: [id number] DOB: [birth year] Today's Date: 03/06/2012 Time: 4098-1191 OT Time Calculation (min): 18 min  OT Assessment / Plan / Recommendation Clinical Impression  Pt admitted for abdominal pain and SOB with pleural effusion.  Pt doing well with mobility and educated to take her time upon d/c at home with rest breaks as needed for fatigue and/or SOB. During ambulation pt's SaO2 dropped to 86% on RA but quickly increased to 93% with short RBand PLB. All education completed.    OT Assessment  Patient does not need any further OT services    Follow Up Recommendations  No OT follow up    Barriers to Discharge      Equipment Recommendations  None recommended by OT    Recommendations for Other Services    Frequency       Precautions / Restrictions Precautions Precautions: None   Pertinent Vitals/Pain Pt denied pain    ADL  Grooming: Modified independent Where Assessed - Grooming: Unsupported standing Upper Body Bathing: Modified independent Where Assessed - Upper Body Bathing: Unsupported standing Lower Body Bathing: Modified independent Where Assessed - Lower Body Bathing: Unsupported sit to stand Upper Body Dressing: Modified independent Where Assessed - Upper Body Dressing: Unsupported sit to stand Lower Body Dressing: Modified independent Where Assessed - Lower Body Dressing: Unsupported sit to stand Toilet Transfer: Modified independent Toilet Transfer Method: Sit to Barista: Comfort height toilet Toileting - Clothing Manipulation and Hygiene: Modified independent Where Assessed - Toileting Clothing Manipulation and Hygiene: Sit to stand from 3-in-1 or toilet Transfers/Ambulation Related to ADLs: Pt ambulated around the room moving personal items around independently without LOB. Educated on energy conservation, taking frequent RBs and pacing self during functional tasks.     OT Diagnosis:    OT Problem List:   OT Treatment Interventions:     OT Goals    Visit Information  Last OT Received On: 03/06/12 Assistance Needed: +1 PT/OT Co-Evaluation/Treatment: Yes    Subjective Data  Subjective: I'm going to be staying with my son. Patient Stated Goal: Not asked.   Prior Functioning     Home Living Lives With: Family Available Help at Discharge: Available PRN/intermittently Type of Home: House Home Access: Stairs to enter [employer] of Steps: 2 Entrance Stairs-Rails: Right Home Layout: Two level;1/2 bath on main level Alternate Level Stairs-Number of Steps: flight Alternate Level Stairs-Rails: Left;Right Bathroom Shower/Tub: Walk-in shower;Tub/shower unit Bathroom Toilet: Standard Home Adaptive Equipment: Straight cane Prior Function Level of Independence: Independent Able to Take Stairs?: Yes Driving: Yes Vocation: Retired Musician: No difficulties Dominant Hand: Right         Vision/Perception     Copywriter, advertising Overall Cognitive Status: Appears within functional limits for tasks assessed/performed Arousal/Alertness: Awake/alert Orientation Level: Appears intact for tasks assessed Behavior During Session: [facility] for tasks performed    Extremity/Trunk Assessment Right Upper Extremity Assessment RUE ROM/Strength/Tone: [facility] for tasks assessed Left Upper Extremity Assessment LUE ROM/Strength/Tone: WFL for tasks assessed Right Lower Extremity Assessment RLE ROM/Strength/Tone: [facility] for tasks assessed Left Lower Extremity Assessment LLE ROM/Strength/Tone: [facility] for tasks assessed     Mobility Bed Mobility Bed Mobility: Not assessed Transfers Sit to Stand: 6: Modified independent (Device/Increase time);From chair/3-in-1 Stand to Sit: 6: Modified independent (Device/Increase time);To chair/3-in-1     Exercise     Balance     End of Session OT - End of Session Activity Tolerance: Patient  tolerated treatment well Patient left:  in chair  GO     Pierra Skora A OTR/L [phone] 03/06/2012, 11:27 AM

## 2012-03-06 NOTE — Evaluation (Signed)
Physical Therapy One Time Evaluation Patient Details Name: ALEXANDERIA GORBY MRN: [id number] DOB: 1945-06-21 Today's Date: 03/06/2012 Time: 4098-1191 PT Time Calculation (min): 14 min  PT Assessment / Plan / Recommendation Clinical Impression  Pt admitted for abdominal pain and SOB with pleural effusion.  Pt doing well with mobility and educated to take her time upon d/c at home with rest breaks as needed for fatigue and/or SOB.  Pt moving around room modified independent on evaluation and only supervision during ambulation to monitor breathing SaO2.  Recommend RN ambulate pt during hospitalization.  No further PT needs identifed at this time.    PT Assessment  Patent does not need any further PT services    Follow Up Recommendations  No PT follow up    Does the patient have the potential to tolerate intense rehabilitation      Barriers to Discharge        Equipment Recommendations  None recommended by PT    Recommendations for Other Services     Frequency      Precautions / Restrictions Precautions Precautions: None   Pertinent Vitals/Pain n/a      Mobility  Bed Mobility Bed Mobility: Not assessed Transfers Transfers: Sit to Stand;Stand to Sit Sit to Stand: 6: Modified independent (Device/Increase time);From chair/3-in-1 Stand to Sit: 6: Modified independent (Device/Increase time);To chair/3-in-1 Ambulation/Gait Ambulation/Gait Assistance: 5: Supervision Ambulation Distance (Feet): 240 Feet Assistive device: None Ambulation/Gait Assistance Details: pt reports mild SOB and SaO2 variable (?reliability of dynamap due to extreme fluctuations within seconds) lowest 86% with quick increase to 93%, RN notified Gait Pattern: Step-through pattern General Gait Details: no unsteadiness or LOB observed    Exercises     PT Diagnosis:    PT Problem List:   PT Treatment Interventions:     PT Goals    Visit Information  Last PT Received On: 03/06/12 Assistance Needed: +1   Subjective Data  Subjective: Oh I'm very independent.   Prior Functioning  Home Living Lives With: Family Available Help at Discharge: Available PRN/intermittently Type of Home: House Home Access: Stairs to enter [employer] of Steps: 2 Entrance Stairs-Rails: Right Home Layout: Two level;1/2 bath on main level Alternate Level Stairs-Number of Steps: flight Alternate Level Stairs-Rails: Left;Right Bathroom Shower/Tub: Walk-in shower;Tub/shower unit Bathroom Toilet: Standard Home Adaptive Equipment: Straight cane Prior Function Level of Independence: Independent Able to Take Stairs?: Yes Driving: Yes Vocation: Retired Musician: No difficulties Dominant Hand: Right    Cognition  Cognition Overall Cognitive Status: Appears within functional limits for tasks assessed/performed Arousal/Alertness: Awake/alert Orientation Level: Appears intact for tasks assessed Behavior During Session: [facility] for tasks performed    Extremity/Trunk Assessment Right Lower Extremity Assessment RLE ROM/Strength/Tone: [facility] for tasks assessed Left Lower Extremity Assessment LLE ROM/Strength/Tone: [facility] for tasks assessed   Balance    End of Session PT - End of Session Activity Tolerance: Patient tolerated treatment well Patient left: in chair;with call bell/phone within reach  GP     Xan Ingraham,KATHrine E 03/06/2012, 10:24 AM Zenovia Jarred, PT, DPT 03/06/2012 Pager: [phone]

## 2012-03-06 NOTE — Progress Notes (Signed)
O2 Sats on Room Air at rest 93%

## 2012-03-06 NOTE — Progress Notes (Signed)
[facility] PROGRESS NOTE  Christine Silva ZOX:[id number] DOB: July 02, 1945 DOA: 03/01/2012 PCP: Carollee Herter, MD  Assessment/Plan  Abdominal pain - Unclear etiology, but has previously been worked up by Dr. Elnoria Howard, who referred her to [facility]. Improving except has persistent nausea.  - Continue hyoscyamine. -  Appreciate GI recommendations -  Schedule Zofran and add Phenergan prn  Chronic cholecystitis based on HIDA.  WBC stable.  Bilirubin mildly elevated, however, right upper quadrant pain improved on exam.   -  Consider outpatient general surgery evaluation unless clinically deteriorates  Shortness of breath with pleural effusion.  Pneumonia less likely to cause bilateral effusions, but may be responsible for LLL effusion, otherwise may have been due to NSAIDS.  Neg 1.6L yesterday and effusions markedly improved on CXR today per my own interpretation.  Radiology read pending.  BUN and bicarb rising.  Blood pressure low normal.   -  Hold NSAIDS -  CTa neg for PE -  ECHO negative for diastolic and systolic heart failure  -  No proteinuria or significant hypoalbuminemia  - TSH WNL -  DC IV lasix and convert to oral tommorrow -  Wean oxygen as tolerated 100% today -  Cont guaifenesin with codeine -  Continue Tessalon   -  Appreciate Pulmonology recommendations:  Repeat CXR in AM   CAP:  Possible pneumonia vs. Atelectasis of lingula -  Continue levofloxacin day 5/7  Sinus congestion - Saline nasal spray prn - s/p Afrin  Headache, due to cough and sinus congestion - Treat cough and sinus congestion - hydrocodone/APAP prn  Diabetes mellitus2 - CBGs elevated -  Increase to lantus 20 -  Continue SSI mod dose  Hypertension/HLD with mostly normal BPs today -  Continue holding BP medications  - continue chol meds.  Arthritis - Continue holding NSAIDs for now. Patient is on hydrocodone for pain relief.  Anemia, macrocytic, hemoglobin trending down slightly - TSH WNL,  B12 WNL, folate pending.  MM less likely as no globulin gap -  Occult stool was neg x 2  -  Tx for hemoglobin < 7 with additional lasix (likely tomorrow)  Thrombocytosis:  May be reactive from acute infection, baseline plts are high -  Consider essential thrombocytosis.  Defer to PCP  Diet:  diabetic Access:  PIV IVF:  None Proph:  SCDs  Code Status: full code Family Communication: spoke with patient alone Disposition Plan: pending improvement in SOB, wean to room air.  PT/OT.  Patient would like to speak to the social worker regarding disability paperwork.     Consultants:  GI  Pulmology  Procedures:  CTa chest  CT ab/pelvis  Abd [place]  CXR  Antibiotics:  Levofloxacin 2/14 >>    HPI/Subjective: Patient states that her cough is somewhat better and she was able to sleep some last night.  She continues to have HA when she has severe cough.  Mild nausea.  Her LLQ abdominal discomfort has improved, denies diarrhea or constipation.    Objective: Filed Vitals:   03/05/12 1431 03/05/12 2230 03/06/12 0551 03/06/12 0600  BP: 124/50 108/58 94/48 105/49  Pulse: 79 80 71   Temp: 98.2 F (36.8 C) 98.2 F (36.8 C) 98.5 F (36.9 C)   TempSrc: Oral Oral Oral   Resp: 20 20 19    Height:      Weight:   69.9 kg (154 lb 1.6 oz)   SpO2: 100% 100% 100%     Intake/Output Summary (Last 24 hours) at 03/06/12 1302 Last  data filed at 03/06/12 1030  Gross per 24 hour  Intake    480 ml  Output   2150 ml  Net  -1670 ml   Filed Weights   03/04/12 0614 03/05/12 0522 03/06/12 0551  Weight: 73.347 kg (161 lb 11.2 oz) 71.6 kg (157 lb 13.6 oz) 69.9 kg (154 lb 1.6 oz)    Exam:   General:  Average to overweight AAF, no acute distress, nasal canula in place  HEENT:  MMM  Cardiovascular:  RRR, 1/6 murmur at the LSB, no rubs or gallops, 2+ pulses, warm extremities  Respiratory:  Rales at the bilateral bases  Abdomen:  NABS, soft, ND, mild TTP in RUQ without rebound or guarding.   Improved from prior.   MSK:  Normal tone and bulk  Neuro:  Grossly intact  Data Reviewed: Basic Metabolic Panel:  Recent Labs Lab 03/02/12 0812 03/03/12 0458 03/04/12 0535 03/05/12 0430 03/06/12 0500  NA 127* 135 135 133* 137  K 4.2 3.8 4.1 3.7 3.3*  CL 94* 101 100 96 95*  CO2 24 27 28  32 34*  GLUCOSE 259* 123* 191* 268* 176*  BUN 18 10 7 12 14   CREATININE 0.97 0.82 0.70 0.79 0.76  CALCIUM 8.7 9.2 9.0 8.8 9.1   Liver Function Tests:  Recent Labs Lab 03/02/12 0110 03/02/12 0812 03/04/12 0535 03/05/12 0430 03/06/12 0500  AST 14 14 19 17 28   ALT 10 10 10 11 16   ALKPHOS 62 56 56 55 62  BILITOT 1.0 1.1 2.2* 1.8* 2.1*  PROT 6.0 5.6* 5.8* 5.5* 6.2  ALBUMIN 3.1* 3.1* 3.2* 3.1* 3.5    Recent Labs Lab 03/01/12 1001 03/02/12 0812  LIPASE 27 22   No results found for this basename: AMMONIA,  in the last 168 hours CBC:  Recent Labs Lab 03/01/12 1001 03/02/12 0110 03/02/12 0812 03/03/12 0458 03/03/12 1051 03/04/12 0535 03/05/12 0430 03/06/12 0500  WBC 13.1* 14.4* 11.6* 7.6 7.4 7.1 8.3 9.0  NEUTROABS 11.1* 10.9* 8.7*  --   --   --   --   --   HGB 10.3* 9.5* 8.6* 7.5* 8.0* 7.3* 7.0* 7.8*  HCT 29.7* 26.0* 24.7* 22.1* 23.7* 21.8* 19.9* 22.7*  MCV 101.0* 101.6* 100.8* 100.0 100.4* 98.6 99.0 97.8  PLT 649* 560* 515* 440* 450* 415* 390 427*   Cardiac Enzymes:  Recent Labs Lab 03/02/12 0110  TROPONINI <0.30   BNP (last 3 results)  Recent Labs  03/02/12 0110  PROBNP 66.3   CBG:  Recent Labs Lab 03/05/12 1208 03/05/12 1641 03/05/12 2223 03/06/12 0739 03/06/12 1217  GLUCAP 191* 231* 208* 178* 288*    Recent Results (from the past 240 hour(s))  URINE CULTURE     Status: None   Collection Time    03/01/12 10:33 AM      Result Value Range Status   Specimen Description URINE, CLEAN CATCH   Final   Special Requests NONE   Final   Culture  Setup Time 03/01/2012 15:28   Final   Colony Count NO GROWTH   Final   Culture NO GROWTH   Final   Report  Status 03/02/2012 FINAL   Final  CULTURE, BLOOD (ROUTINE X 2)     Status: None   Collection Time    03/02/12  1:10 AM      Result Value Range Status   Specimen Description BLOOD BLOOD RIGHT FOREARM   Final   Special Requests BOTTLES DRAWN AEROBIC AND ANAEROBIC 4 CC EACH  Final   Culture  Setup Time 03/02/2012 13:24   Final   Culture     Final   Value:        BLOOD CULTURE RECEIVED NO GROWTH TO DATE CULTURE WILL BE HELD FOR 5 DAYS BEFORE ISSUING A FINAL NEGATIVE REPORT   Report Status PENDING   Incomplete  CULTURE, BLOOD (ROUTINE X 2)     Status: None   Collection Time    03/02/12  2:50 AM      Result Value Range Status   Specimen Description BLOOD LH   Final   Special Requests NONE BOTTLES DRAWN AEROBIC AND ANAEROBIC 2CC   Final   Culture  Setup Time 03/02/2012 13:25   Final   Culture     Final   Value:        BLOOD CULTURE RECEIVED NO GROWTH TO DATE CULTURE WILL BE HELD FOR 5 DAYS BEFORE ISSUING A FINAL NEGATIVE REPORT   Report Status PENDING   Incomplete     Studies: Dg Chest 2 View  03/06/2012  *RADIOLOGY REPORT*  Clinical Data: Evaluate effusion, weakness, shortness of breath.  CHEST - 2 VIEW  Comparison: 03/03/2012  Findings: There is a small left pleural effusion, slightly decreased since prior study.  Improving left base atelectasis or infiltrate.  Suspect trace right effusion.  No focal opacity on the right.  Heart is normal size.  IMPRESSION: Improving left effusion and left lower lobe atelectasis or infiltrate.  Trace right effusion.   Original Report Authenticated By: Charlett Nose, M.D.     Scheduled Meds: . benzonatate  100 mg Oral TID  . famotidine  20 mg Oral QHS  . furosemide  40 mg Intravenous Q12H  . [START ON 03/07/2012] furosemide  20 mg Oral Daily  . hyoscyamine  0.125 mg Sublingual Q6H  . insulin aspart  0-15 Units Subcutaneous TID WC  . insulin aspart  0-5 Units Subcutaneous QHS  . insulin glargine  15 Units Subcutaneous q morning - 10a  . levofloxacin  750  mg Oral Daily  . pantoprazole  40 mg Oral Daily  . simvastatin  20 mg Oral q1800  . sodium chloride  3 mL Intravenous Q12H  . sodium chloride  3 mL Intravenous Q12H   Continuous Infusions:   Principal Problem:   Abdominal pain Active Problems:   Diabetes mellitus due to underlying condition with diabetic neuropathy   Pleural effusion   SOB (shortness of breath)   Hyponatremia   Leukocytosis, unspecified   Macrocytic anemia   Thrombocytosis   Acute kidney injury   Chronic cholecystitis    Time spent: 30 min    Mirayah Wren, [facility]  [facility] Pager [phone]. If 7PM-7AM, please contact night-coverage at [email or web address], password [facility] 03/06/2012, 1:02 PM  LOS: 5 days

## 2012-03-07 LAB — COMPREHENSIVE METABOLIC PANEL
AST: 24 U/L (ref 0–37)
CO2: 32 mEq/L (ref 19–32)
Chloride: 97 mEq/L (ref 96–112)
Creatinine, Ser: 0.82 mg/dL (ref 0.50–1.10)
GFR calc non Af Amer: 73 mL/min — ABNORMAL LOW (ref >90)
Total Bilirubin: 2 mg/dL — ABNORMAL HIGH (ref 0.3–1.2)

## 2012-03-07 LAB — CBC
MCH: 33.2 pg (ref 26.0–34.0)
MCHC: 33.6 g/dL (ref 30.0–36.0)
Platelets: 490 10*3/uL — ABNORMAL HIGH (ref 150–400)
RBC: 2.44 MIL/uL — ABNORMAL LOW (ref 3.87–5.11)

## 2012-03-07 LAB — GLUCOSE, CAPILLARY
Glucose-Capillary: 218 mg/dL — ABNORMAL HIGH (ref 70–99)
Glucose-Capillary: 123 mg/dL — ABNORMAL HIGH (ref 70–99)

## 2012-03-07 MED ORDER — PANTOPRAZOLE SODIUM 40 MG PO TBEC: 40.0000 mg | DELAYED_RELEASE_TABLET | Freq: Every day | ORAL | Status: DC

## 2012-03-07 MED ORDER — HYOSCYAMINE SULFATE 0.125 MG SL SUBL: 0.1250 mg | SUBLINGUAL_TABLET | Freq: Four times a day (QID) | SUBLINGUAL | Status: DC

## 2012-03-07 MED ORDER — BENZONATATE 100 MG PO CAPS: 100.0000 mg | ORAL_CAPSULE | Freq: Three times a day (TID) | ORAL | Status: DC | PRN

## 2012-03-07 MED ORDER — HYDROCODONE-ACETAMINOPHEN 5-325 MG PO TABS: ORAL_TABLET | ORAL | Status: DC

## 2012-03-07 MED ORDER — LEVOFLOXACIN 750 MG PO TABS: 750.0000 mg | ORAL_TABLET | Freq: Every day | ORAL | Status: DC

## 2012-03-07 NOTE — Discharge Summary (Signed)
Physician Discharge Summary  Christine Silva:[id number] DOB: Jun 15, 1945 DOA: 03/01/2012  PCP: Carollee Herter, MD  Admit date: 03/01/2012 Discharge date: 03/07/2012  Time spent:>78minutes  Recommendations for Outpatient Follow-up:      Follow-up Information   Follow up with Carollee Herter, MD.   Contact information:   [address] [place] [address] [phone]       Follow up with Vanita Panda., MD. (with  [facility], call for appt upon discharge)    Contact information:   [address], [address] [place] [address] [phone]       Discharge Diagnoses:  Principal Problem:   Abdominal pain Active Problems:   Diabetes mellitus due to underlying condition with diabetic neuropathy   Pleural effusion   SOB (shortness of breath)   Hyponatremia   Leukocytosis, unspecified   Macrocytic anemia   Thrombocytosis   Acute kidney injury   Chronic cholecystitis   Discharge Condition: Improved/stable  Diet recommendation: Modified carbohydrate  Filed Weights   03/05/12 0522 03/06/12 0551 03/07/12 0625  Weight: 71.6 kg (157 lb 13.6 oz) 69.9 kg (154 lb 1.6 oz) 69.7 kg (153 lb 10.6 oz)    History of present illness:  Christine Silva is a 67 y.o. female history of diabetes mellitus type 2, hypertension, hyperlipidemia and arthritis presented to the ER with complaints of abdominal pain. The pain is mostly on the left lower quadrant colicky in nature with some nausea and poor appetite denies any diarrhea. Denies any recent use of antibiotics. Denies any fever chills. In addition patient is also having shortness of breath since yesterday. Shortness of breath is exertional and has nonproductive cough. Patient had come yesterday with complaints of abdominal pain and at that time CT abdomen and pelvis showed large collection of fluid and UA were showing positive UTI patient was discharged on Cipro and Flagyl with pain relief medications. Despite  taking this patient still had abdominal discomfort and presented back to the ER last evening with in addition complains of shortness of breath. Patient otherwise denies any chest pain. In the ER chest x-ray shows pleural effusion and reviewing her CT abdomen shows mild pericardial effusion. EKG shows sinus tachycardia. BNP and cardiac enzymes are negative d-dimer is elevated. Levaquin was started for possible pneumonia. Patient has been admitted for further management.   Hospital Course:  Abdominal pain -unclear etiology As discussed above, upon admission GI was consulted and Dr. Elnoria Howard saw the patient and recommended starting her on hyoscyamine, as well as working up for PE. He stated(having seen patient in the past and referred her to [facility]) that patient had always responded in the past to the hyoscyamine. A CT angiogram of the chest was done and came back negative for PE . A HIDA scan was also done on admission which showed no evidence of cystic duct obstruction/acute cholecystitis with the finding of slight hyperemia was noted adjacent to the gallbladder suggesting some degree of biliary dysfunction and possibly chronic cholecystitis.  -The patient was managed symptomatically and she improved on the hyoscyamine. She has remained afebrile with no leukocytosis and is tolerating by mouth well -Discussed patient's HIDA scan with surgery today prior to discharge and Dr. Maisie Fus agrees with outpatient followup with surgery, and does not recommend placing her on antibiotics for this.  - Consider outpatient general surgery evaluation unless clinically deteriorates  Shortness of breath with pleural effusion. Upon admission she had a CT angiogram which showed moderate to moderately large bilateral pleural effusions  with associated compressive atelectasis, as some airspace disease in the lingula which could be atelectasis or pneumonia and it was negative for PE. CCM was consulted and saw patient, the impression was  that Pneumonia less likely to cause bilateral effusions, but may be responsible for LLL effusion, otherwise may have been due to NSAIDS as per CCM consult note. She was empirically treated with antibiotics and also with diuresis with Lasix and a chest x-ray showed improvement of the  -The NSAIDs were discontinued and she has been instructed to discontinue them upon discharge.  - ECHO negative for diastolic and systolic heart failure  - No proteinuria or significant hypoalbuminemia - TSH WNL - she continued to improve with the above management and has been weaned off O2 at this time-oxygenating at 92-100% on room air.   CAP: Possible pneumonia vs. Atelectasis of lingula -As discussed above she was treated with antibiotics and improved clinically. She has remained afebrile and hemodynamically stable with no leukocytosis. She will be discharged on oral antibiotics to complete the treatment course.  Diabetes mellitus2 - -her Accu-Cheks were monitored and she was placed on Lantus and covered with sliding scale insulin while in the hospital. She is to continue outpatient regimen upon discharge. -Hypertension/HLD  -She is to continue outpatient medications upon discharge and followup with her primary care physician. - Arthritis -  Her  NSAIDs were discontinued secondary to #2, she is to continue hydrocodone for pain management upon discharge and follow up outpatient . Anemia, macrocytic, hemoglobin trending down slightly -  workup includedTSH WNL, B12 WNL, folate him back elevated at 3860. MM less likely as no globulin gap  - Occult stool was neg x 2  - Tx for hemoglobin < 7 with additional lasix (likely tomorrow)  Thrombocytosis: May be reactive from acute infection or the anemia , baseline plts are high - she is to follow up outpatient.   Procedures:  none  Consultations:  GI-Dr. Elnoria Howard  CCM-Dr. Wert  Discharge Exam: Filed Vitals:   03/07/12 0700 03/07/12 0852 03/07/12 1028 03/07/12 1400   BP:   106/45 116/36  Pulse:  90  107  Temp:    98.3 F (36.8 C)  TempSrc:    Oral  Resp:    18  Height:      Weight:      SpO2: 92%   100%   Exam:  General: Average to overweight AAF, no acute distress, nasal canula in place  HEENT: MMM  Cardiovascular: RRR, 1/6 murmur at the LSB, no rubs or gallops, 2+ pulses, warm extremities  Respiratory: Rales at the bilateral bases  Abdomen: Soft, bowel sounds present Improved from prior.  MSK: Normal tone and bulk  Neuro: Grossly intact   Discharge Instructions  Discharge Orders   Future Orders Complete By Expires     Diet Carb Modified  As directed     Increase activity slowly  As directed         Medication List    STOP taking these medications       ciprofloxacin 500 MG tablet  Commonly known as:  CIPRO     diclofenac 75 MG EC tablet  Commonly known as:  VOLTAREN     ibuprofen 200 MG tablet  Commonly known as:  ADVIL,MOTRIN     metroNIDAZOLE 500 MG tablet  Commonly known as:  FLAGYL      TAKE these medications       aspirin 81 MG tablet  Take 81 mg by mouth  daily.     benzonatate 100 MG capsule  Commonly known as:  TESSALON  Take 1 capsule (100 mg total) by mouth 3 (three) times daily as needed for cough.     diclofenac sodium 1 % Gel  Commonly known as:  VOLTAREN  Apply 1 application topically 4 (four) times daily.     HYDROcodone-acetaminophen 5-325 MG per tablet  Commonly known as:  NORCO/VICODIN  Take one-half to one tablets PO every 6 hours as needed for severe pain     hyoscyamine 0.125 MG SL tablet  Commonly known as:  LEVSIN SL  Place 1 tablet (0.125 mg total) under the tongue every 6 (six) hours.     insulin glargine 100 UNIT/ML injection  Commonly known as:  LANTUS  Inject 10 Units into the skin every morning.     losartan-hydrochlorothiazide 50-12.5 MG per tablet  Commonly known as:  HYZAAR  Take 1 tablet by mouth daily.     meloxicam 15 MG tablet  Commonly known as:  MOBIC  TAKE ONE  TABLET BY MOUTH EVERY DAY     metFORMIN 500 MG tablet  Commonly known as:  GLUCOPHAGE  Take 1 tablet (500 mg total) by mouth 2 (two) times daily with a meal.     NYQUIL 60-7.06-15-998 MG/30ML Liqd  Generic drug:  Pseudoeph-Doxylamine-DM-APAP  Take 30 mLs by mouth every 6 (six) hours as needed (cough).     ondansetron 8 MG disintegrating tablet  Commonly known as:  ZOFRAN ODT  Take 1 tablet (8 mg total) by mouth every 8 (eight) hours as needed for nausea.     pantoprazole 40 MG tablet  Commonly known as:  PROTONIX  Take 1 tablet (40 mg total) by mouth daily.     pravastatin 40 MG tablet  Commonly known as:  PRAVACHOL  Take 1 tablet (40 mg total) by mouth daily.      Levaquin 750 mg by mouth daily         Follow-up Information   Follow up with Carollee Herter, MD.   Contact information:   [address] [place] [address] [phone]       Follow up with Vanita Panda., MD. (with  [facility], call for appt upon discharge)    Contact information:   [address], [address] [place] [address] [phone]        The results of significant diagnostics from this hospitalization (including imaging, microbiology, ancillary and laboratory) are listed below for reference.    Significant Diagnostic Studies: Dg Chest 2 View  03/06/2012  *RADIOLOGY REPORT*  Clinical Data: Evaluate effusion, weakness, shortness of breath.  CHEST - 2 VIEW  Comparison: 03/03/2012  Findings: There is a small left pleural effusion, slightly decreased since prior study.  Improving left base atelectasis or infiltrate.  Suspect trace right effusion.  No focal opacity on the right.  Heart is normal size.  IMPRESSION: Improving left effusion and left lower lobe atelectasis or infiltrate.  Trace right effusion.   Original Report Authenticated By: Charlett Nose, M.D.    Dg Chest 2 View  03/03/2012  *RADIOLOGY REPORT*  Clinical Data: Dyspnea.  CHEST - 2 VIEW  Comparison: Chest  x-ray 03/01/2012.  Findings: Lung volumes have slightly improved compared to the recent prior examination.  Decreasing bilateral pleural effusions (moderate on the left and small on the right).  Persistent bibasilar opacities may reflect atelectasis and/or consolidation in the lower lobes of the lungs bilaterally.  No evidence of pulmonary  edema.  Heart size is normal.  Upper mediastinal contours are within normal limits.  Atherosclerosis in the thoracic aorta.  IMPRESSION: 1.  Improving aeration in the lung bases bilaterally compatible with decreasing bilateral pleural effusions and resolving bibasilar subsegmental atelectasis and/or air space consolidation, as above. 2.  Atherosclerosis.   Original Report Authenticated By: Trudie Reed, M.D.    Dg Chest 2 View  03/01/2012  *RADIOLOGY REPORT*  Clinical Data: Shortness of breath.  CHEST - 2 VIEW  Comparison: None.  Findings: Moderate bilateral pleural effusions are noted. Underlying pneumonia or atelectasis cannot be excluded. Cardiomediastinal silhouette appears normal.  Impression:  Moderate bilateral pleural effusions with possible underlying pneumonia or atelectasis.   Original Report Authenticated By: Lupita Raider.,  M.D.    Ct Angio Chest Pe W/cm &/or Wo Cm  03/02/2012  *RADIOLOGY REPORT*  Clinical Data: Shortness of breath.  CT ANGIOGRAPHY CHEST  Technique:  Multidetector CT imaging of the chest using the standard protocol during bolus administration of intravenous contrast. Multiplanar reconstructed images including MIPs were obtained and reviewed to evaluate the vascular anatomy.  Contrast: 80mL OMNIPAQUE IOHEXOL 350 MG/ML SOLN  Comparison: PA and lateral chest 03/01/2012.  CT chest 08/04/2005.  Findings: No pulmonary embolus is identified.  The patient has moderate to moderately large bilateral pleural effusions.  There is infiltration of subcutaneous fat along the anterior aspect of the chest wall centrally and on the left.  No focal fluid  collection in the subcutaneous tissues is identified.  There is no axillary, hilar or mediastinal lymphadenopathy.  Cardiomegaly is noted. Lungs demonstrate bilateral compressive atelectasis.  There is also some airspace disease in the lingula with some air bronchograms identified.  Scattered ground-glass attenuation is noted. Incidentally imaged upper abdomen is unremarkable.  No focal bony abnormality is identified.  IMPRESSION:  1.  Negative for pulmonary embolus. 2.  Moderate to moderately large bilateral pleural effusions with associated compressive atelectasis.  There is also some airspace disease in the lingula which could be atelectasis or pneumonia. 3.  Infiltration of subcutaneous fat of the upper anterior chest wall centrally and on the left could be due to subcutaneous edema, contusion or cellulitis.   Original Report Authenticated By: Holley Dexter, M.D.    Nm Hepatobiliary Liver Func  03/03/2012  *RADIOLOGY REPORT*  Clinical Data:  67 year old female with right upper quadrant abdominal pain and cholelithiasis identified on recent ultrasound.  NUCLEAR MEDICINE HEPATOBILIARY IMAGING  Technique:  Sequential images of the abdomen were obtained out to 60 minutes following intravenous administration of radiopharmaceutical.  Radiopharmaceutical:  6.70mCi Tc-1m Choletec 2.8 mg of morphine was administered intravenously during the procedure as well.  Comparison:  03/02/2012 ultrasound  Findings: Prompt homogeneous hepatic activity is identified.  There is slight increased activity in the liver adjacent to the gallbladder fossa. Small bowel activity is identified at 15 minutes.  As gallbladder activity was not identified at 60 minutes, 2.8 mg of IV morphine was administered without complication.  Gallbladder activity is identified at 5-10 minutes following morphine administration.  IMPRESSION: No evidence of cystic duct obstruction/acute cholecystitis. However, delay of gallbladder filling and possible  slight hyperemia in the liver adjacent to the gallbladder suggests some degree of biliary dysfunction and possibly chronic cholecystitis.   Original Report Authenticated By: Harmon Pier, M.D.    US Abdomen Complete  03/02/2012  *RADIOLOGY REPORT*  Clinical Data:  Abdominal pain.  ABDOMINAL ULTRASOUND COMPLETE  Comparison:  CT of the abdomen and pelvis performed 03/01/2012, and abdominal ultrasound performed  07/18/2008  Findings:  Gallbladder:  Multiple small stones are seen layering dependently within the gallbladder.  These are mobile in nature.  No gallbladder wall thickening or pericholecystic fluid is seen.  No ultrasonographic Murphy's sign is elicited.  Common Bile Duct:  0.7 cm in diameter; borderline normal for the patient's age.  Liver:  Normal parenchymal echogenicity and echotexture; no focal lesions identified.  Limited Doppler evaluation demonstrates normal blood flow within the liver.  IVC:  Unremarkable in appearance.  Pancreas:  Although the pancreas is difficult to visualize in its entirety due to overlying bowel gas, no focal pancreatic abnormality is identified.  Spleen:  9.6 cm in length; within normal limits in size and echotexture.  Right kidney:  10.8 cm in length; normal in size, configuration and parenchymal echogenicity.  No evidence of mass or hydronephrosis.  Left kidney:  13.0 cm in length; normal in size, configuration and parenchymal echogenicity.  No evidence of mass or hydronephrosis. Not well characterized due to overlying bowel gas.  Abdominal Aorta:  Normal in caliber; no aneurysm identified.  Small bilateral pleural effusions are seen, right greater than left.  IMPRESSION:  1.  No acute abnormality seen within the abdomen. 2.  Cholelithiasis again noted; gallbladder otherwise unremarkable in appearance.   Original Report Authenticated By: Tonia Ghent, M.D.    Ct Abdomen Pelvis W Contrast  03/01/2012  *RADIOLOGY REPORT*  Clinical Data: , history hypertension, diabetes,  hyperlipidemia  CT ABDOMEN AND PELVIS WITH CONTRAST  Technique:  Multidetector CT imaging of the abdomen and pelvis was performed following the standard protocol during bolus administration of intravenous contrast. Sagittal and coronal MPR images reconstructed from axial data set.  Contrast: OMNIPAQUE IOHEXOL 300 MG/ML  SOLN Dilute oral contrast.  Comparison: 04/01/2009  Findings: Bilateral pleural effusions and basilar atelectasis greater on the right. Minimal pericardial effusion. Distended gallbladder with dependent calculi. No definite gallbladder wall thickening or pericholecystic edema by CT. Stable right adrenal nodule 16 x 11 mm image 19. Tiny left renal cysts.  Remainder of liver, spleen, kidneys, and adrenal glands normal appearance. Pancreas enhances normally without discrete mass or calcification. Small umbilical hernia containing fat. Normal appendix. Right inguinal hernia containing fat. Unremarkable bladder, uterus and adnexae.  Extensive edema is seen throughout the retroperitoneum extending into the pelvis and into presacral space. Minimal ascites. No mass, adenopathy or free air. Stomach and bowel loops grossly normal appearance. No acute osseous findings.  IMPRESSION: Bibasilar effusions and atelectasis greater on right. Minimal pericardial effusion. Stable right adrenal nodule. Cholelithiasis without definite evidence of cholecystitis by CT, which is insensitive. Right inguinal and umbilical hernias and containing fat. Extensive edema throughout the retroperitoneum beginning inferior to pancreas extending into the presacral space; this is nonspecific and could be seen with nonspecific infection, pancreatitis, urinary tract infection. No GI source is identified. The lack of distention of the IVC and hepatic veins makes failure less likely.  No definite subcutaneous or additional soft tissue edema seen to suggest this is slowly related to hypoproteinemia. Correlation with urinalysis and serum  amylase recommended. If there is clinical suspicion of acute cholecystitis, recommend ultrasound assessment.  Findings called to Renne Crigler on 03/01/2012 at 1418 hours.   Original Report Authenticated By: Ulyses Southward, M.D.     Microbiology: Recent Results (from the past 240 hour(s))  URINE CULTURE     Status: None   Collection Time    03/01/12 10:33 AM      Result Value Range Status   Specimen Description  URINE, CLEAN CATCH   Final   Special Requests NONE   Final   Culture  Setup Time 03/01/2012 15:28   Final   Colony Count NO GROWTH   Final   Culture NO GROWTH   Final   Report Status 03/02/2012 FINAL   Final  CULTURE, BLOOD (ROUTINE X 2)     Status: None   Collection Time    03/02/12  1:10 AM      Result Value Range Status   Specimen Description BLOOD BLOOD RIGHT FOREARM   Final   Special Requests BOTTLES DRAWN AEROBIC AND ANAEROBIC 4 CC EACH   Final   Culture  Setup Time 03/02/2012 13:24   Final   Culture     Final   Value:        BLOOD CULTURE RECEIVED NO GROWTH TO DATE CULTURE WILL BE HELD FOR 5 DAYS BEFORE ISSUING A FINAL NEGATIVE REPORT   Report Status PENDING   Incomplete  CULTURE, BLOOD (ROUTINE X 2)     Status: None   Collection Time    03/02/12  2:50 AM      Result Value Range Status   Specimen Description BLOOD LH   Final   Special Requests NONE BOTTLES DRAWN AEROBIC AND ANAEROBIC 2CC   Final   Culture  Setup Time 03/02/2012 13:25   Final   Culture     Final   Value:        BLOOD CULTURE RECEIVED NO GROWTH TO DATE CULTURE WILL BE HELD FOR 5 DAYS BEFORE ISSUING A FINAL NEGATIVE REPORT   Report Status PENDING   Incomplete     Labs: Basic Metabolic Panel:  Recent Labs Lab 03/03/12 0458 03/04/12 0535 03/05/12 0430 03/06/12 0500 03/07/12 0510  NA 135 135 133* 137 135  K 3.8 4.1 3.7 3.3* 4.2  CL 101 100 96 95* 97  CO2 27 28 32 34* 32  GLUCOSE 123* 191* 268* 176* 170*  BUN 10 7 12 14 10   CREATININE 0.82 0.70 0.79 0.76 0.82  CALCIUM 9.2 9.0 8.8 9.1 9.7    Liver Function Tests:  Recent Labs Lab 03/02/12 0812 03/04/12 0535 03/05/12 0430 03/06/12 0500 03/07/12 0510  AST 14 19 17 28 24   ALT 10 10 11 16 16   ALKPHOS 56 56 55 62 62  BILITOT 1.1 2.2* 1.8* 2.1* 2.0*  PROT 5.6* 5.8* 5.5* 6.2 6.1  ALBUMIN 3.1* 3.2* 3.1* 3.5 3.4*    Recent Labs Lab 03/01/12 1001 03/02/12 0812  LIPASE 27 22   No results found for this basename: AMMONIA,  in the last 168 hours CBC:  Recent Labs Lab 03/01/12 1001 03/02/12 0110 03/02/12 0812  03/03/12 1051 03/04/12 0535 03/05/12 0430 03/06/12 0500 03/07/12 0510  WBC 13.1* 14.4* 11.6*  < > 7.4 7.1 8.3 9.0 8.7  NEUTROABS 11.1* 10.9* 8.7*  --   --   --   --   --   --   HGB 10.3* 9.5* 8.6*  < > 8.0* 7.3* 7.0* 7.8* 8.1*  HCT 29.7* 26.0* 24.7*  < > 23.7* 21.8* 19.9* 22.7* 24.1*  MCV 101.0* 101.6* 100.8*  < > 100.4* 98.6 99.0 97.8 98.8  PLT 649* 560* 515*  < > 450* 415* 390 427* 490*  < > = values in this interval not displayed. Cardiac Enzymes:  Recent Labs Lab 03/02/12 0110  TROPONINI <0.30   BNP: BNP (last 3 results)  Recent Labs  03/02/12 0110  PROBNP 66.3   CBG:  Recent Labs  Lab 03/06/12 1217 03/06/12 1702 03/06/12 2206 03/07/12 0759 03/07/12 1127  GLUCAP 288* 225* 161* 218* 274*       Signed:  Vasiliki Smaldone C  [facility] 03/07/2012, 4:33 PM

## 2012-03-08 LAB — CULTURE, BLOOD (ROUTINE X 2): Culture: NO GROWTH

## 2012-03-14 ENCOUNTER — Ambulatory Visit (INDEPENDENT_AMBULATORY_CARE_PROVIDER_SITE_OTHER): Payer: Medicare Other | Admitting: Family Medicine

## 2012-03-14 ENCOUNTER — Encounter: Payer: Self-pay | Admitting: Family Medicine

## 2012-03-14 VITALS — BP 110/50 | HR 78 | Temp 98.5°F | Wt 159.0 lb

## 2012-03-14 DIAGNOSIS — D649 Anemia, unspecified: Secondary | ICD-10-CM

## 2012-03-14 DIAGNOSIS — E0840 Diabetes mellitus due to underlying condition with diabetic neuropathy, unspecified: Secondary | ICD-10-CM

## 2012-03-14 DIAGNOSIS — E1142 Type 2 diabetes mellitus with diabetic polyneuropathy: Secondary | ICD-10-CM

## 2012-03-14 DIAGNOSIS — E1169 Type 2 diabetes mellitus with other specified complication: Secondary | ICD-10-CM

## 2012-03-14 DIAGNOSIS — J90 Pleural effusion, not elsewhere classified: Secondary | ICD-10-CM

## 2012-03-14 DIAGNOSIS — I10 Essential (primary) hypertension: Secondary | ICD-10-CM

## 2012-03-14 DIAGNOSIS — E1349 Other specified diabetes mellitus with other diabetic neurological complication: Secondary | ICD-10-CM

## 2012-03-14 DIAGNOSIS — M129 Arthropathy, unspecified: Secondary | ICD-10-CM

## 2012-03-14 DIAGNOSIS — E1159 Type 2 diabetes mellitus with other circulatory complications: Secondary | ICD-10-CM

## 2012-03-14 NOTE — Progress Notes (Signed)
  Subjective:    Patient ID: Christine Silva, female    DOB: 1945-02-08, 67 y.o.   MRN: [id number]  HPI She is here for a followup visit. She also scheduled for knee surgery sometime this spring. She was recently in the hospital and evaluated for abdominal pain as well as bilateral pleural effusions. The emergency room record and hospital records were reviewed. She had an extensive workup on her abdominal pain and basically responded to anti-spasmodic. Also the pleural effusions were evaluated. She was treated for pneumonia and before she left the x-rays did clear. She has been on an antibiotic but is presently not on one. At this time she is having no difficulty with chest pain, shortness of breath, coughing, abdominal pain, nausea or vomiting. She does complain of left knee pain. She has an impending trip to [place] prior to her surgery.   Review of Systems     Objective:   Physical Exam alert and in no distress. Tympanic membranes and canals are normal. Throat is clear. Tonsils are normal. Neck is supple without adenopathy or thyromegaly. Cardiac exam shows a regular sinus rhythm without murmurs or gallops. Lungs are clear to auscultation.        Assessment & Plan:  Diabetes mellitus due to underlying condition with diabetic neuropathy  Hypertension associated with diabetes  Pleural effusion  Arthritis  Anemia I initially did sign a paper to have surgery however upon further review, I will have her return here for followup visit concerning the anemia as well as her underlying diabetes ,etc.

## 2012-03-14 NOTE — Patient Instructions (Signed)
Come back when it's convenient and we will go overall your medications to make sure you're taking them correctly

## 2012-04-12 ENCOUNTER — Ambulatory Visit (INDEPENDENT_AMBULATORY_CARE_PROVIDER_SITE_OTHER): Payer: Medicare Other | Admitting: Family Medicine

## 2012-04-12 ENCOUNTER — Encounter: Payer: Self-pay | Admitting: Family Medicine

## 2012-04-12 ENCOUNTER — Other Ambulatory Visit: Payer: Self-pay

## 2012-04-12 DIAGNOSIS — D649 Anemia, unspecified: Secondary | ICD-10-CM

## 2012-04-12 DIAGNOSIS — E1142 Type 2 diabetes mellitus with diabetic polyneuropathy: Secondary | ICD-10-CM

## 2012-04-12 DIAGNOSIS — E1349 Other specified diabetes mellitus with other diabetic neurological complication: Secondary | ICD-10-CM

## 2012-04-12 DIAGNOSIS — E1159 Type 2 diabetes mellitus with other circulatory complications: Secondary | ICD-10-CM

## 2012-04-12 DIAGNOSIS — M25569 Pain in unspecified knee: Secondary | ICD-10-CM

## 2012-04-12 DIAGNOSIS — E0840 Diabetes mellitus due to underlying condition with diabetic neuropathy, unspecified: Secondary | ICD-10-CM

## 2012-04-12 LAB — CBC WITH DIFFERENTIAL/PLATELET
Basophils Absolute: 0.1 10*3/uL (ref 0.0–0.1)
Basophils Relative: 1 % (ref 0–1)
Eosinophils Absolute: 0.2 10*3/uL (ref 0.0–0.7)
Lymphs Abs: 1.9 10*3/uL (ref 0.7–4.0)
MCH: 30.6 pg (ref 26.0–34.0)
MCHC: 33.5 g/dL (ref 30.0–36.0)
Neutrophils Relative %: 56 % (ref 43–77)
Platelets: 480 10*3/uL — ABNORMAL HIGH (ref 150–400)
RBC: 2.94 MIL/uL — ABNORMAL LOW (ref 3.87–5.11)

## 2012-04-12 MED ORDER — LOSARTAN POTASSIUM-HCTZ 50-12.5 MG PO TABS: 1.0000 | ORAL_TABLET | Freq: Every day | ORAL | Status: DC

## 2012-04-12 NOTE — Progress Notes (Signed)
  Subjective:    Patient ID: Christine Silva, female    DOB: 01-31-1945, 67 y.o.   MRN: [id number]  HPI He is here for recheck. She recently returned from a vacation in [place]. She admits to dietary indiscretion and notes blood sugars as high as in the 200s. She continues on medications listed in the chart. Review of the record indicates previous difficulty with anemia. I did ask her to return here prior to surgery to reassess her anemia and diabetes.   Review of Systems     Objective:   Physical Exam Alert and in no distress otherwise not examined       Assessment & Plan:  Diabetes mellitus due to underlying condition with diabetic neuropathy - Plan: Hemoglobin A1c  Anemia - Plan: CBC with Differential  Knee pain, unspecified laterality she will schedule surgery after we have had a chance to look at her blood work.

## 2012-04-12 NOTE — Telephone Encounter (Signed)
SENT IN B/P MEDS 

## 2012-04-13 LAB — HEMOGLOBIN A1C: Hgb A1c MFr Bld: 5 % (ref <5.7)

## 2012-04-18 ENCOUNTER — Encounter (HOSPITAL_COMMUNITY): Payer: Self-pay | Admitting: Pharmacy Technician

## 2012-04-23 ENCOUNTER — Ambulatory Visit (INDEPENDENT_AMBULATORY_CARE_PROVIDER_SITE_OTHER): Payer: Medicare Other | Admitting: Family Medicine

## 2012-04-23 DIAGNOSIS — D649 Anemia, unspecified: Secondary | ICD-10-CM

## 2012-04-23 LAB — CBC WITH DIFFERENTIAL/PLATELET
Basophils Relative: 1 % (ref 0–1)
Eosinophils Absolute: 0.2 10*3/uL (ref 0.0–0.7)
MCH: 31.7 pg (ref 26.0–34.0)
MCHC: 34.3 g/dL (ref 30.0–36.0)
Neutrophils Relative %: 52 % (ref 43–77)
Platelets: 433 10*3/uL — ABNORMAL HIGH (ref 150–400)
RDW: 14.8 % (ref 11.5–15.5)

## 2012-04-23 NOTE — Progress Notes (Signed)
  Subjective:    Patient ID: Christine Silva, female    DOB: Nov 07, 1945, 67 y.o.   MRN: [id number]  HPI She is here for recheck. She is scheduled for a knee replacement. She continues on iron supplementation. Her last hemoglobin was 9. She is here today for recheck.   Review of Systems     Objective:   Physical Exam Alert and in no distress otherwise not examined       Assessment & Plan:  Anemia - Plan: CBC with Differential

## 2012-04-24 NOTE — Pre-Procedure Instructions (Signed)
Christine Silva  04/24/2012   Your procedure is scheduled on:  Tuesday, April 15th   Report to [facility] at  5:30 AM.  Call this number if you have problems the morning of surgery: [phone]   Remember:   Do not eat food or drink liquids after midnight Monday.    Take these medicines the morning of surgery with A SIP OF WATER: Pain medication              Do NOT take insulin the morning of surgery.   Do not wear jewelry, make-up or nail polish.  Do not wear lotions, powders, or perfumes. You may NOT wear deodorant.  Do not shave underarms & legs 48 hours prior to surgery.    Do not bring valuables to the hospital.  Contacts, dentures or bridgework may not be worn into surgery.   Leave suitcase in the car. After surgery it may be brought to your room.  For patients admitted to the hospital, checkout time is 11:00 AM the day of discharge.   Name and phone number of your driver:    Special Instructions: Shower using CHG 2 nights before surgery and the night before surgery.  If you shower the day of surgery use CHG.  Use special wash - you have one bottle of CHG for all showers.  You should use approximately 1/3 of the bottle for each shower.   Please read over the following fact sheets that you were given: Pain Booklet, Coughing and Deep Breathing, MRSA Information and Surgical Site Infection Prevention

## 2012-04-24 NOTE — Progress Notes (Signed)
Quick Note:  CALLED PT TO INFORM HER OF HER LABS WORD FOR WORD  Let her know that her hemoglobin is slowly improving. Have her continue on the iron supplement and we can check this in several months. PT VERBALIZED UNDERSTANDING   ______

## 2012-04-25 ENCOUNTER — Encounter (HOSPITAL_COMMUNITY)
Admission: RE | Admit: 2012-04-25 | Discharge: 2012-04-25 | Disposition: A | Discharge status: Home / Self Care | Payer: Medicare Other | Source: Ambulatory Visit | Attending: Orthopedic Surgery | Admitting: Orthopedic Surgery

## 2012-04-25 ENCOUNTER — Encounter (HOSPITAL_COMMUNITY): Payer: Self-pay

## 2012-04-25 HISTORY — DX: Unspecified osteoarthritis, unspecified site: M19.90

## 2012-04-25 HISTORY — DX: Pneumonia, unspecified organism: J18.9

## 2012-04-25 LAB — CBC
Hemoglobin: 9.3 g/dL — ABNORMAL LOW (ref 12.0–15.0)
MCV: 93.5 fL (ref 78.0–100.0)
Platelets: 411 10*3/uL — ABNORMAL HIGH (ref 150–400)
RBC: 2.76 MIL/uL — ABNORMAL LOW (ref 3.87–5.11)
WBC: 5.5 10*3/uL (ref 4.0–10.5)

## 2012-04-25 LAB — SURGICAL PCR SCREEN: Staphylococcus aureus: NEGATIVE

## 2012-04-25 LAB — PROTIME-INR: INR: 1.09 (ref 0.00–1.49)

## 2012-04-25 LAB — BASIC METABOLIC PANEL
CO2: 27 mEq/L (ref 19–32)
Calcium: 10 mg/dL (ref 8.4–10.5)
Chloride: 98 mEq/L (ref 96–112)
Glucose, Bld: 298 mg/dL — ABNORMAL HIGH (ref 70–99)
Potassium: 3.9 mEq/L (ref 3.5–5.1)
Sodium: 136 mEq/L (ref 135–145)

## 2012-04-25 LAB — APTT: aPTT: 29 seconds (ref 24–37)

## 2012-04-26 NOTE — Progress Notes (Signed)
Blood bank needs new sample Dos,pt has antibodies.

## 2012-04-27 NOTE — Progress Notes (Signed)
Anesthesia chart review: Patient is a 67 year old female posted for left TKA by Dr. Dion Saucier on 05/01/12.  History includes DM2, non-smoker, HTN, HLD, anemia, arthritis, proctitis with history of chronic intermittent LLQ abdominal pain (Dr. Elnoria Howard), hospitalization in February 2014 for community acquired PNA with bilateral pleural effusions 02/2012.  PCP is Dr. Susann Givens who is aware of planned surgery (see clearance note and notes in Epic).  He recently started her on iron for anemia and has had some improvement (Hgb primarily 7-8 during her hospitalization in February 2014 and now 9-9.8--review of labs from August 2012 - December 2013 show her hgb between ~ 9 - 10 on four different occasions.)   She was seen by GI Dr. Jeani Hawking during her February 2012 admission for complaints of chronic, intermittent LLQ pain of unclear etiology--she was restarted on hyoscyamine which had helped her symptoms in the past. Occult stool was negative X 2 at that time.  She had a sigmoidoscopy in July 2013 that showed colitis and medium hemorrhoids (note scanned under Media tab).  EKG on 03/01/12 showed SBT @ 107 bpm.  Echo on 03/02/12 showed: - Left ventricle: The cavity size was normal. Wall thickness was increased in a pattern of mild LVH. The estimated ejection fraction was 70%. Wall motion was normal; there were no regional wall motion abnormalities. - Aortic valve: Sclerosis without stenosis. - Right ventricle: The cavity size was normal. Systolic function was normal. - Pulmonary arteries: PA peak pressure: 48mm Hg (S). - Tricuspid valve: Mild regurgitation. - Impressions: There is no significant peridardial effusion.  Currently, her last CXR was from 03/06/12 and showed improving left effusion and LLL ATX/infiltrate, trace right effusion.  Will plan to repeat a CXR on the day of surgery to re-evaluate effusions and ATX/infiltrate.  Preoperative labs noted.  Non-fasting glucose was 298--however, her HgbA1C was normal  at 5.0 just a few weeks ago on 04/12/12.  H/H 9.3/25.8, still anemic, but overall improved since February 2014 after being started on iron.  As above, she has really had a degree of chronic anemia since at least 2012.  She is followed by GI Dr. Elnoria Howard and her PCP Dr. Susann Givens.  Rarely, has her hgb been over 10 in the last two years, so it may be within her usual baseline range. I have called her H/H and glucose results to [facility] at Dr. Shelba Flake office. Blood Bank states they are have blood available if needed.  Aram Beecham, PAT RN notes indicate blood blank will need an additional specimen on the day of surgery.    Patient has had recent close follow-up with her PCP regarding anemia and has been placed on iron.  Her recent overall trends have been in the 9 range, which seems to be within her predominant range of 9-10 since August 2012.  Her occult stool was negative in February, and she had a sigmoidoscopy in 2013 by Dr. Elnoria Howard.  Her recent HgbA1C was normal, so hopefully her fasting CBG will show an improved glucose reading on the day of surgery.  Dr. Susann Givens is also aware of the planned procedure. If her follow-up CXR is reasonable and no symptomatic anemia then would anticipate that she could proceed as planned.  She will be evaluated by her assigned anesthesiologist on the day of surgery.  Velna Ochs [facility]/Anesthesiology Phone [phone] 04/27/2012 2:35 PM

## 2012-05-01 ENCOUNTER — Encounter (HOSPITAL_COMMUNITY): Payer: Self-pay | Admitting: Anesthesiology

## 2012-05-01 ENCOUNTER — Inpatient Hospital Stay (HOSPITAL_COMMUNITY): Payer: Medicare Other | Admitting: Anesthesiology

## 2012-05-01 ENCOUNTER — Inpatient Hospital Stay (HOSPITAL_COMMUNITY): Payer: Medicare Other

## 2012-05-01 ENCOUNTER — Inpatient Hospital Stay (HOSPITAL_COMMUNITY)
Admission: RE | Admit: 2012-05-01 | Discharge: 2012-05-04 | DRG: 470 | Disposition: A | Discharge status: Skilled Nursing Facility | Payer: Medicare Other | Source: Ambulatory Visit | Attending: Orthopedic Surgery | Admitting: Orthopedic Surgery

## 2012-05-01 ENCOUNTER — Encounter (HOSPITAL_COMMUNITY)
Admission: RE | Disposition: A | Discharge status: Skilled Nursing Facility | Payer: Self-pay | Source: Ambulatory Visit | Attending: Orthopedic Surgery

## 2012-05-01 DIAGNOSIS — Z833 Family history of diabetes mellitus: Secondary | ICD-10-CM

## 2012-05-01 DIAGNOSIS — M1710 Unilateral primary osteoarthritis, unspecified knee: Principal | ICD-10-CM | POA: Diagnosis present

## 2012-05-01 DIAGNOSIS — Z79899 Other long term (current) drug therapy: Secondary | ICD-10-CM

## 2012-05-01 DIAGNOSIS — I10 Essential (primary) hypertension: Secondary | ICD-10-CM | POA: Diagnosis present

## 2012-05-01 DIAGNOSIS — D62 Acute posthemorrhagic anemia: Secondary | ICD-10-CM | POA: Diagnosis present

## 2012-05-01 DIAGNOSIS — Z7982 Long term (current) use of aspirin: Secondary | ICD-10-CM

## 2012-05-01 DIAGNOSIS — Z01812 Encounter for preprocedural laboratory examination: Secondary | ICD-10-CM

## 2012-05-01 DIAGNOSIS — M1712 Unilateral primary osteoarthritis, left knee: Secondary | ICD-10-CM | POA: Diagnosis present

## 2012-05-01 DIAGNOSIS — E1149 Type 2 diabetes mellitus with other diabetic neurological complication: Secondary | ICD-10-CM | POA: Diagnosis present

## 2012-05-01 DIAGNOSIS — E1142 Type 2 diabetes mellitus with diabetic polyneuropathy: Secondary | ICD-10-CM | POA: Diagnosis present

## 2012-05-01 DIAGNOSIS — Z7901 Long term (current) use of anticoagulants: Secondary | ICD-10-CM

## 2012-05-01 DIAGNOSIS — E785 Hyperlipidemia, unspecified: Secondary | ICD-10-CM | POA: Diagnosis present

## 2012-05-01 DIAGNOSIS — E0840 Diabetes mellitus due to underlying condition with diabetic neuropathy, unspecified: Secondary | ICD-10-CM | POA: Diagnosis present

## 2012-05-01 DIAGNOSIS — Z794 Long term (current) use of insulin: Secondary | ICD-10-CM

## 2012-05-01 HISTORY — DX: Unilateral primary osteoarthritis, left knee: M17.12

## 2012-05-01 HISTORY — PX: TOTAL KNEE ARTHROPLASTY: SHX125

## 2012-05-01 LAB — CREATININE, SERUM
Creatinine, Ser: 0.57 mg/dL (ref 0.50–1.10)
GFR calc non Af Amer: >90 mL/min (ref >90)

## 2012-05-01 LAB — GLUCOSE, CAPILLARY: Glucose-Capillary: 179 mg/dL — ABNORMAL HIGH (ref 70–99)

## 2012-05-01 LAB — CBC
HCT: 24.2 % — ABNORMAL LOW (ref 36.0–46.0)
Hemoglobin: 8.7 g/dL — ABNORMAL LOW (ref 12.0–15.0)
MCH: 34.4 pg — ABNORMAL HIGH (ref 26.0–34.0)
MCHC: 36 g/dL (ref 30.0–36.0)
RBC: 2.53 MIL/uL — ABNORMAL LOW (ref 3.87–5.11)

## 2012-05-01 SURGERY — ARTHROPLASTY, KNEE, TOTAL
Anesthesia: General | Site: Knee | Laterality: Left | Wound class: Clean

## 2012-05-01 MED ORDER — SIMVASTATIN 20 MG PO TABS
20.0000 mg | ORAL_TABLET | Freq: Every day | ORAL | Status: DC
  Administered 2012-05-01 – 2012-05-03 (×3): 20 mg via ORAL
  Filled 2012-05-01 (×6): qty 1

## 2012-05-01 MED ORDER — ENOXAPARIN SODIUM 30 MG/0.3ML ~~LOC~~ SOLN
30.0000 mg | SUBCUTANEOUS | Status: DC
  Administered 2012-05-02 – 2012-05-04 (×3): 30 mg via SUBCUTANEOUS
  Filled 2012-05-01 (×4): qty 0.3

## 2012-05-01 MED ORDER — ACETAMINOPHEN 650 MG RE SUPP: 650.0000 mg | Freq: Four times a day (QID) | RECTAL | Status: DC | PRN

## 2012-05-01 MED ORDER — PHENOL 1.4 % MT LIQD: 1.0000 | OROMUCOSAL | Status: DC | PRN

## 2012-05-01 MED ORDER — INSULIN ASPART 100 UNIT/ML ~~LOC~~ SOLN
0.0000 [IU] | Freq: Three times a day (TID) | SUBCUTANEOUS | Status: DC
  Administered 2012-05-01: 3 [IU] via SUBCUTANEOUS
  Administered 2012-05-02: 09:00:00 via SUBCUTANEOUS
  Administered 2012-05-02: 5 [IU] via SUBCUTANEOUS
  Administered 2012-05-02: 1 [IU] via SUBCUTANEOUS
  Administered 2012-05-03: 5 [IU] via SUBCUTANEOUS
  Administered 2012-05-03 (×2): 3 [IU] via SUBCUTANEOUS
  Administered 2012-05-04: 5 [IU] via SUBCUTANEOUS
  Administered 2012-05-04: 3 [IU] via SUBCUTANEOUS

## 2012-05-01 MED ORDER — HYDROMORPHONE HCL PF 1 MG/ML IJ SOLN
0.5000 mg | INTRAMUSCULAR | Status: DC | PRN
  Administered 2012-05-01: 0.5 mg via INTRAVENOUS
  Filled 2012-05-01 (×2): qty 1

## 2012-05-01 MED ORDER — WARFARIN VIDEO
Freq: Once | Status: AC
  Administered 2012-05-01: 13:00:00

## 2012-05-01 MED ORDER — PATIENT'S GUIDE TO USING COUMADIN BOOK
Freq: Once | Status: AC
  Administered 2012-05-01: 13:00:00
  Filled 2012-05-01: qty 1

## 2012-05-01 MED ORDER — OXYCODONE-ACETAMINOPHEN 10-325 MG PO TABS
1.0000 | ORAL_TABLET | Freq: Four times a day (QID) | ORAL | Status: DC | PRN
Start: 2012-05-01 — End: 2013-07-22

## 2012-05-01 MED ORDER — SENNA-DOCUSATE SODIUM 8.6-50 MG PO TABS: 1.0000 | ORAL_TABLET | Freq: Every day | ORAL | Status: DC

## 2012-05-01 MED ORDER — WARFARIN SODIUM 5 MG PO TABS: 5.0000 mg | ORAL_TABLET | Freq: Every day | ORAL | Status: DC

## 2012-05-01 MED ORDER — ASPIRIN 81 MG PO TABS: 81.0000 mg | ORAL_TABLET | Freq: Every day | ORAL | Status: DC

## 2012-05-01 MED ORDER — ASPIRIN 81 MG PO CHEW
81.0000 mg | CHEWABLE_TABLET | Freq: Every day | ORAL | Status: DC
  Administered 2012-05-01 – 2012-05-04 (×4): 81 mg via ORAL
  Filled 2012-05-01 (×4): qty 1

## 2012-05-01 MED ORDER — LIDOCAINE HCL (CARDIAC) 20 MG/ML IV SOLN
INTRAVENOUS | Status: DC | PRN
  Administered 2012-05-01: 30 mg via INTRAVENOUS

## 2012-05-01 MED ORDER — ONDANSETRON HCL 4 MG/2ML IJ SOLN
INTRAMUSCULAR | Status: DC | PRN
  Administered 2012-05-01: 4 mg via INTRAVENOUS

## 2012-05-01 MED ORDER — DIPHENHYDRAMINE HCL 12.5 MG/5ML PO ELIX: 12.5000 mg | ORAL_SOLUTION | ORAL | Status: DC | PRN

## 2012-05-01 MED ORDER — PHENYLEPHRINE HCL 10 MG/ML IJ SOLN
10.0000 mg | INTRAVENOUS | Status: DC | PRN
  Administered 2012-05-01: 25 ug/min via INTRAVENOUS

## 2012-05-01 MED ORDER — ALUM & MAG HYDROXIDE-SIMETH 200-200-20 MG/5ML PO SUSP: 30.0000 mL | ORAL | Status: DC | PRN

## 2012-05-01 MED ORDER — METHOCARBAMOL 500 MG PO TABS
500.0000 mg | ORAL_TABLET | Freq: Four times a day (QID) | ORAL | Status: DC | PRN
  Administered 2012-05-01 – 2012-05-04 (×6): 500 mg via ORAL
  Filled 2012-05-01 (×7): qty 1

## 2012-05-01 MED ORDER — CEFAZOLIN SODIUM-DEXTROSE 2-3 GM-% IV SOLR
2.0000 g | Freq: Four times a day (QID) | INTRAVENOUS | Status: AC
  Administered 2012-05-01 – 2012-05-02 (×2): 2 g via INTRAVENOUS
  Filled 2012-05-01 (×2): qty 50

## 2012-05-01 MED ORDER — BUPIVACAINE-EPINEPHRINE PF 0.5-1:200000 % IJ SOLN
INTRAMUSCULAR | Status: DC | PRN
  Administered 2012-05-01: 15 mL

## 2012-05-01 MED ORDER — POLYETHYLENE GLYCOL 3350 17 G PO PACK
17.0000 g | PACK | Freq: Every day | ORAL | Status: DC | PRN
  Administered 2012-05-01: 17 g via ORAL
  Filled 2012-05-01: qty 1

## 2012-05-01 MED ORDER — KETOROLAC TROMETHAMINE 15 MG/ML IJ SOLN
7.5000 mg | Freq: Four times a day (QID) | INTRAMUSCULAR | Status: AC
  Administered 2012-05-01 – 2012-05-02 (×4): 7.5 mg via INTRAVENOUS
  Filled 2012-05-01 (×4): qty 1

## 2012-05-01 MED ORDER — ONDANSETRON HCL 4 MG/2ML IJ SOLN: 4.0000 mg | Freq: Four times a day (QID) | INTRAMUSCULAR | Status: DC | PRN

## 2012-05-01 MED ORDER — LACTATED RINGERS IV SOLN
INTRAVENOUS | Status: DC | PRN
  Administered 2012-05-01 (×2): via INTRAVENOUS

## 2012-05-01 MED ORDER — SORBITOL 70 % SOLN
30.0000 mL | Freq: Every day | Status: DC | PRN
  Administered 2012-05-03: 30 mL via ORAL
  Filled 2012-05-01: qty 30

## 2012-05-01 MED ORDER — OXYCODONE HCL 5 MG PO TABS
5.0000 mg | ORAL_TABLET | ORAL | Status: DC | PRN
  Administered 2012-05-01 – 2012-05-04 (×14): 10 mg via ORAL
  Filled 2012-05-01 (×14): qty 2

## 2012-05-01 MED ORDER — CEFAZOLIN SODIUM-DEXTROSE 2-3 GM-% IV SOLR: 2.0000 g | INTRAVENOUS | Status: DC

## 2012-05-01 MED ORDER — ONDANSETRON HCL 4 MG/2ML IJ SOLN: 4.0000 mg | Freq: Once | INTRAMUSCULAR | Status: DC | PRN

## 2012-05-01 MED ORDER — ACETAMINOPHEN 10 MG/ML IV SOLN
1000.0000 mg | Freq: Four times a day (QID) | INTRAVENOUS | Status: AC
  Administered 2012-05-01 – 2012-05-02 (×3): 1000 mg via INTRAVENOUS
  Filled 2012-05-01 (×4): qty 100

## 2012-05-01 MED ORDER — MENTHOL 3 MG MT LOZG: 1.0000 | LOZENGE | OROMUCOSAL | Status: DC | PRN

## 2012-05-01 MED ORDER — MIDAZOLAM HCL 5 MG/5ML IJ SOLN
INTRAMUSCULAR | Status: DC | PRN
  Administered 2012-05-01: 1 mg via INTRAVENOUS

## 2012-05-01 MED ORDER — SODIUM CHLORIDE 0.9 % IR SOLN
Status: DC | PRN
  Administered 2012-05-01: 3000 mL

## 2012-05-01 MED ORDER — HYDROMORPHONE HCL PF 1 MG/ML IJ SOLN
0.2500 mg | INTRAMUSCULAR | Status: DC | PRN
  Administered 2012-05-01 (×4): 0.25 mg via INTRAVENOUS

## 2012-05-01 MED ORDER — ZOLPIDEM TARTRATE 5 MG PO TABS: 5.0000 mg | ORAL_TABLET | Freq: Every evening | ORAL | Status: DC | PRN

## 2012-05-01 MED ORDER — WARFARIN - PHARMACIST DOSING INPATIENT
Freq: Every day | Status: DC
  Administered 2012-05-01: 18:00:00

## 2012-05-01 MED ORDER — FENTANYL CITRATE 0.05 MG/ML IJ SOLN
INTRAMUSCULAR | Status: DC | PRN
  Administered 2012-05-01 (×2): 50 ug via INTRAVENOUS
  Administered 2012-05-01: 25 ug via INTRAVENOUS
  Administered 2012-05-01 (×2): 50 ug via INTRAVENOUS
  Administered 2012-05-01: 100 ug via INTRAVENOUS
  Administered 2012-05-01 (×3): 50 ug via INTRAVENOUS
  Administered 2012-05-01: 25 ug via INTRAVENOUS

## 2012-05-01 MED ORDER — SENNA 8.6 MG PO TABS
1.0000 | ORAL_TABLET | Freq: Two times a day (BID) | ORAL | Status: DC
  Administered 2012-05-01 – 2012-05-04 (×7): 8.6 mg via ORAL
  Filled 2012-05-01 (×8): qty 1

## 2012-05-01 MED ORDER — SODIUM CHLORIDE 0.9 % IR SOLN
Status: DC | PRN
  Administered 2012-05-01: 1

## 2012-05-01 MED ORDER — METOCLOPRAMIDE HCL 5 MG/ML IJ SOLN: 5.0000 mg | Freq: Three times a day (TID) | INTRAMUSCULAR | Status: DC | PRN

## 2012-05-01 MED ORDER — LOSARTAN POTASSIUM-HCTZ 50-12.5 MG PO TABS: 1.0000 | ORAL_TABLET | Freq: Every day | ORAL | Status: DC

## 2012-05-01 MED ORDER — PNEUMOCOCCAL VAC POLYVALENT 25 MCG/0.5ML IJ INJ
0.5000 mL | INJECTION | INTRAMUSCULAR | Status: DC
  Filled 2012-05-01: qty 0.5

## 2012-05-01 MED ORDER — CEFAZOLIN SODIUM-DEXTROSE 2-3 GM-% IV SOLR
2.0000 g | Freq: Four times a day (QID) | INTRAVENOUS | Status: AC
  Administered 2012-05-01: 2 g via INTRAVENOUS
  Filled 2012-05-01 (×2): qty 50

## 2012-05-01 MED ORDER — INSULIN GLARGINE 100 UNIT/ML ~~LOC~~ SOLN
10.0000 [IU] | Freq: Every morning | SUBCUTANEOUS | Status: DC
  Administered 2012-05-01 – 2012-05-04 (×4): 10 [IU] via SUBCUTANEOUS
  Filled 2012-05-01 (×5): qty 0.1

## 2012-05-01 MED ORDER — PROPOFOL 10 MG/ML IV BOLUS
INTRAVENOUS | Status: DC | PRN
  Administered 2012-05-01: 100 mg via INTRAVENOUS

## 2012-05-01 MED ORDER — ADULT MULTIVITAMIN W/MINERALS CH
1.0000 | ORAL_TABLET | Freq: Every day | ORAL | Status: DC
  Administered 2012-05-01 – 2012-05-04 (×4): 1 via ORAL
  Filled 2012-05-01 (×4): qty 1

## 2012-05-01 MED ORDER — ACETAMINOPHEN 325 MG PO TABS
650.0000 mg | ORAL_TABLET | Freq: Four times a day (QID) | ORAL | Status: DC | PRN
  Administered 2012-05-03 (×2): 650 mg via ORAL
  Filled 2012-05-01 (×2): qty 2

## 2012-05-01 MED ORDER — ONDANSETRON HCL 4 MG PO TABS: 4.0000 mg | ORAL_TABLET | Freq: Four times a day (QID) | ORAL | Status: DC | PRN

## 2012-05-01 MED ORDER — FERROUS SULFATE 325 (65 FE) MG PO TABS
325.0000 mg | ORAL_TABLET | Freq: Every day | ORAL | Status: DC
  Administered 2012-05-02 – 2012-05-04 (×3): 325 mg via ORAL
  Filled 2012-05-01 (×4): qty 1

## 2012-05-01 MED ORDER — POTASSIUM CHLORIDE IN NACL 20-0.45 MEQ/L-% IV SOLN
INTRAVENOUS | Status: DC
  Administered 2012-05-01 – 2012-05-02 (×2): via INTRAVENOUS
  Filled 2012-05-01 (×7): qty 1000

## 2012-05-01 MED ORDER — LOSARTAN POTASSIUM 50 MG PO TABS
50.0000 mg | ORAL_TABLET | Freq: Every day | ORAL | Status: DC
  Administered 2012-05-01 – 2012-05-04 (×3): 50 mg via ORAL
  Filled 2012-05-01 (×4): qty 1

## 2012-05-01 MED ORDER — DOCUSATE SODIUM 100 MG PO CAPS
100.0000 mg | ORAL_CAPSULE | Freq: Two times a day (BID) | ORAL | Status: DC
  Administered 2012-05-01 – 2012-05-04 (×7): 100 mg via ORAL
  Filled 2012-05-01 (×8): qty 1

## 2012-05-01 MED ORDER — HYDROMORPHONE HCL PF 1 MG/ML IJ SOLN
INTRAMUSCULAR | Status: AC
  Administered 2012-05-01: 0.5 mg via INTRAVENOUS
  Filled 2012-05-01: qty 1

## 2012-05-01 MED ORDER — WARFARIN SODIUM 5 MG PO TABS
5.0000 mg | ORAL_TABLET | Freq: Every day | ORAL | Status: DC
  Administered 2012-05-01 – 2012-05-03 (×3): 5 mg via ORAL
  Filled 2012-05-01 (×5): qty 1

## 2012-05-01 MED ORDER — PHENYLEPHRINE HCL 10 MG/ML IJ SOLN
INTRAMUSCULAR | Status: DC | PRN
  Administered 2012-05-01 (×3): 80 ug via INTRAVENOUS
  Administered 2012-05-01: 40 ug via INTRAVENOUS
  Administered 2012-05-01: 80 ug via INTRAVENOUS
  Administered 2012-05-01: 40 ug via INTRAVENOUS

## 2012-05-01 MED ORDER — HYDROCHLOROTHIAZIDE 12.5 MG PO CAPS
12.5000 mg | ORAL_CAPSULE | Freq: Every day | ORAL | Status: DC
  Administered 2012-05-01 – 2012-05-04 (×3): 12.5 mg via ORAL
  Filled 2012-05-01 (×4): qty 1

## 2012-05-01 MED ORDER — ARTIFICIAL TEARS OP OINT
TOPICAL_OINTMENT | OPHTHALMIC | Status: DC | PRN
  Administered 2012-05-01: 1 via OPHTHALMIC

## 2012-05-01 MED ORDER — BUPIVACAINE HCL (PF) 0.25 % IJ SOLN
INTRAMUSCULAR | Status: AC
  Filled 2012-05-01: qty 30

## 2012-05-01 MED ORDER — DEXTROSE 5 % IV SOLN: 500.0000 mg | Freq: Four times a day (QID) | INTRAVENOUS | Status: DC | PRN

## 2012-05-01 MED ORDER — CEFAZOLIN SODIUM-DEXTROSE 2-3 GM-% IV SOLR
INTRAVENOUS | Status: AC
  Administered 2012-05-01: 2 g via INTRAVENOUS
  Filled 2012-05-01: qty 50

## 2012-05-01 MED ORDER — MAGNESIUM CITRATE PO SOLN
1.0000 | Freq: Once | ORAL | Status: AC | PRN
  Filled 2012-05-01: qty 296

## 2012-05-01 MED ORDER — METFORMIN HCL 500 MG PO TABS
500.0000 mg | ORAL_TABLET | Freq: Two times a day (BID) | ORAL | Status: DC
  Administered 2012-05-01 – 2012-05-04 (×6): 500 mg via ORAL
  Filled 2012-05-01 (×9): qty 1

## 2012-05-01 MED ORDER — METOCLOPRAMIDE HCL 10 MG PO TABS: 5.0000 mg | ORAL_TABLET | Freq: Three times a day (TID) | ORAL | Status: DC | PRN

## 2012-05-01 SURGICAL SUPPLY — 65 items
APL SKNCLS STERI-STRIP NONHPOA (GAUZE/BANDAGES/DRESSINGS) ×1
BANDAGE ELASTIC 6 VELCRO ST LF (GAUZE/BANDAGES/DRESSINGS) ×2 IMPLANT
BANDAGE ESMARK 6X9 LF (GAUZE/BANDAGES/DRESSINGS) ×1 IMPLANT
BENZOIN TINCTURE PRP APPL 2/3 (GAUZE/BANDAGES/DRESSINGS) ×2 IMPLANT
BLADE SAG 18X100X1.27 (BLADE) ×2 IMPLANT
BLADE SAW RECIP 87.9 MT (BLADE) ×2 IMPLANT
BLADE SAW SGTL 13X75X1.27 (BLADE) ×2 IMPLANT
BNDG CMPR 9X6 STRL LF SNTH (GAUZE/BANDAGES/DRESSINGS) ×1
BNDG CMPR MED 15X6 ELC VLCR LF (GAUZE/BANDAGES/DRESSINGS) ×1
BNDG ELASTIC 6X15 VLCR STRL LF (GAUZE/BANDAGES/DRESSINGS) ×1 IMPLANT
BNDG ESMARK 6X9 LF (GAUZE/BANDAGES/DRESSINGS) ×2
BOOTCOVER CLEANROOM LRG (PROTECTIVE WEAR) ×4 IMPLANT
BOWL SMART MIX CTS (DISPOSABLE) ×2 IMPLANT
CATH FOLEY 2WAY SLVR  5CC 14FR (CATHETERS) ×1
CATH FOLEY 2WAY SLVR 5CC 14FR (CATHETERS) ×1 IMPLANT
CEMENT HV SMART SET (Cement) ×4 IMPLANT
CLOTH BEACON ORANGE TIMEOUT ST (SAFETY) ×2 IMPLANT
COVER SURGICAL LIGHT HANDLE (MISCELLANEOUS) ×2 IMPLANT
CUFF TOURNIQUET SINGLE 34IN LL (TOURNIQUET CUFF) ×1 IMPLANT
DRAPE EXTREMITY T 121X128X90 (DRAPE) ×2 IMPLANT
DRAPE PROXIMA HALF (DRAPES) ×2 IMPLANT
DRAPE U-SHAPE 47X51 STRL (DRAPES) ×2 IMPLANT
DRSG PAD ABDOMINAL 8X10 ST (GAUZE/BANDAGES/DRESSINGS) ×2 IMPLANT
DURAPREP 26ML APPLICATOR (WOUND CARE) ×2 IMPLANT
ELECT CAUTERY BLADE 6.4 (BLADE) ×2 IMPLANT
ELECT REM PT RETURN 9FT ADLT (ELECTROSURGICAL) ×2
ELECTRODE REM PT RTRN 9FT ADLT (ELECTROSURGICAL) ×1 IMPLANT
FACESHIELD LNG OPTICON STERILE (SAFETY) ×4 IMPLANT
GLOVE BIO SURGEON STRL SZ8 (GLOVE) ×2 IMPLANT
GLOVE BIOGEL PI ORTHO PRO SZ8 (GLOVE) ×2
GLOVE ORTHO TXT STRL SZ7.5 (GLOVE) ×6 IMPLANT
GLOVE PI ORTHO PRO STRL SZ8 (GLOVE) ×2 IMPLANT
GLOVE SURG ORTHO 8.0 STRL STRW (GLOVE) ×2 IMPLANT
GOWN BRE IMP PREV XXLGXLNG (GOWN DISPOSABLE) ×2 IMPLANT
GOWN STRL NON-REIN LRG LVL3 (GOWN DISPOSABLE) ×2 IMPLANT
GOWN STRL REIN XL XLG (GOWN DISPOSABLE) ×2 IMPLANT
HANDPIECE INTERPULSE COAX TIP (DISPOSABLE) ×2
HOOD PEEL AWAY FACE SHEILD DIS (HOOD) ×4 IMPLANT
IMMOBILIZER KNEE 22 UNIV (SOFTGOODS) IMPLANT
KIT BASIN OR (CUSTOM PROCEDURE TRAY) ×2 IMPLANT
KIT ROOM TURNOVER OR (KITS) ×2 IMPLANT
MANIFOLD NEPTUNE II (INSTRUMENTS) ×2 IMPLANT
NS IRRIG 1000ML POUR BTL (IV SOLUTION) ×2 IMPLANT
PACK TOTAL JOINT (CUSTOM PROCEDURE TRAY) ×2 IMPLANT
PAD ARMBOARD 7.5X6 YLW CONV (MISCELLANEOUS) ×4 IMPLANT
PAD CAST 4YDX4 CTTN HI CHSV (CAST SUPPLIES) IMPLANT
PADDING CAST COTTON 4X4 STRL (CAST SUPPLIES)
PADDING CAST COTTON 6X4 STRL (CAST SUPPLIES) ×2 IMPLANT
SET HNDPC FAN SPRY TIP SCT (DISPOSABLE) ×1 IMPLANT
SOLUTION BETADINE 4OZ (MISCELLANEOUS) ×1 IMPLANT
SPONGE GAUZE 4X4 12PLY (GAUZE/BANDAGES/DRESSINGS) ×2 IMPLANT
STAPLER VISISTAT 35W (STAPLE) ×2 IMPLANT
STRIP CLOSURE SKIN 1/2X4 (GAUZE/BANDAGES/DRESSINGS) ×2 IMPLANT
SUCTION FRAZIER TIP 10 FR DISP (SUCTIONS) ×2 IMPLANT
SUT MNCRL AB 4-0 PS2 18 (SUTURE) ×1 IMPLANT
SUT VIC AB 0 CT1 27 (SUTURE) ×2
SUT VIC AB 0 CT1 27XBRD ANBCTR (SUTURE) ×1 IMPLANT
SUT VIC AB 2-0 CT1 27 (SUTURE) ×2
SUT VIC AB 2-0 CT1 TAPERPNT 27 (SUTURE) ×1 IMPLANT
SUT VIC AB 3-0 SH 8-18 (SUTURE) ×3 IMPLANT
SYR 30ML LL (SYRINGE) ×2 IMPLANT
TOWEL OR 17X24 6PK STRL BLUE (TOWEL DISPOSABLE) ×2 IMPLANT
TOWEL OR 17X26 10 PK STRL BLUE (TOWEL DISPOSABLE) ×2 IMPLANT
TRAY FOLEY CATH 14FR (SET/KITS/TRAYS/PACK) ×1 IMPLANT
WATER STERILE IRR 1000ML POUR (IV SOLUTION) ×2 IMPLANT

## 2012-05-01 NOTE — Progress Notes (Signed)
ANTICOAGULATION CONSULT NOTE - Initial Consult  Pharmacy Consult for Coumadin Indication: VTE prophylaxis  No Known Allergies  Medical History: Past Medical History  Diagnosis Date  . Hyperlipidemia   . Hypertension   . Diabetes mellitus   . Anemia   . Other specified disorder of rectum and anus   . Pneumonia Feb 2014  . Arthritis   . Osteoarthritis of left knee 05/01/2012   Assessment: 67 year old female beginning Coumadin for VTE prophylaxis.  Goal of Therapy:  INR 2-3 Monitor platelets by anticoagulation protocol: Yes   Plan:  1) Coumadin 5 mg po daily at 1800 pm 2) Daily INR 3) Coumadin education  Thank you. Okey Regal, PharmD [phone]  05/01/2012,12:22 PM

## 2012-05-01 NOTE — Anesthesia Preprocedure Evaluation (Addendum)
Anesthesia Evaluation  Patient identified by MRN, date of birth, ID band Patient awake    Reviewed: Allergy & Precautions, H&P , NPO status , Patient's Chart, lab work & pertinent test results  Airway Mallampati: II TM Distance: >3 FB Neck ROM: Full    Dental  (+) Edentulous Upper and Edentulous Lower   Pulmonary shortness of breath and with exertion, pneumonia -, resolved,          Cardiovascular hypertension, Pt. on medications Rhythm:Regular     Neuro/Psych    GI/Hepatic   Endo/Other  diabetes, Well Controlled, Type 2, Insulin Dependent and Oral Hypoglycemic Agents  Renal/GU Renal InsufficiencyRenal disease     Musculoskeletal   Abdominal   Peds  Hematology   Anesthesia Other Findings   Reproductive/Obstetrics                           Anesthesia Physical Anesthesia Plan  ASA: III  Anesthesia Plan: General   Post-op Pain Management: MAC Combined w/ Regional for Post-op pain   Induction: Intravenous  Airway Management Planned: Oral ETT  Additional Equipment:   Intra-op Plan:   Post-operative Plan: Extubation in OR  Informed Consent: I have reviewed the patients History and Physical, chart, labs and discussed the procedure including the risks, benefits and alternatives for the proposed anesthesia with the patient or authorized representative who has indicated his/her understanding and acceptance.     Plan Discussed with: CRNA, Anesthesiologist and Surgeon  Anesthesia Plan Comments:         Anesthesia Quick Evaluation

## 2012-05-01 NOTE — Op Note (Signed)
DATE OF SURGERY:  05/01/2012 TIME: 9:23 AM  PATIENT NAME:  Christine Silva   AGE: 67 y.o.    PRE-OPERATIVE DIAGNOSIS:  DJD LEFT KNEE  POST-OPERATIVE DIAGNOSIS:  Same  PROCEDURE:  Procedure(s): TOTAL KNEE ARTHROPLASTY   SURGEON:  Eulas Post, MD   ASSISTANT:  Janace Litten, OPA-C, present and scrubbed throughout the case, critical for assistance with exposure, retraction, instrumentation, and closure.   OPERATIVE IMPLANTS: Depuy PFC Sigma, Posterior Stabilized.  Femur size 3, Tibia size 3, Patella size 35 3-peg oval button, with a 10 mm polyethylene insert.   PREOPERATIVE INDICATIONS:  Christine Silva is a 67 y.o. year old female with end stage bone on bone degenerative arthritis of the knee who failed conservative treatment, including injections, antiinflammatories, activity modification, and assistive devices, and had significant impairment of their activities of daily living, and elected for Total Knee Arthroplasty.   The risks, benefits, and alternatives were discussed at length including but not limited to the risks of infection, bleeding, nerve injury, stiffness, blood clots, the need for revision surgery, cardiopulmonary complications, among others, and they were willing to proceed.   OPERATIVE DESCRIPTION:  The patient was brought to the operative room and placed in a supine position.  General anesthesia was administered.  IV antibiotics were given.  The lower extremity was prepped and draped in the usual sterile fashion.  Time out was performed.  The leg was elevated and exsanguinated and the tourniquet was inflated.  Anterior quadriceps tendon splitting approach was performed.  The patella was everted and osteophytes were removed.  The anterior horn of the medial and lateral meniscus was removed.   The distal femur was opened with the drill and the intramedullary distal femoral cutting jig was utilized, set at 5 degrees resecting 10 mm off the distal femur.  Care was  taken to protect the collateral ligaments.  Then the extramedullary tibial cutting jig was utilized making the appropriate cut using the anterior tibial crest as a reference building in appropriate posterior slope.  The slope was set to 18, and the alignment jig set at one notch over from the midline. Care was taken during the cut to protect the medial and collateral ligaments.  The proximal tibia was removed along with the posterior horns of the menisci.  The PCL was sacrificed.    The extensor gap was measured and was approximately 10mm.    The distal femoral sizing jig was applied, taking care to avoid notching.  Then the 4-in-1 cutting jig was applied and the anterior and posterior femur was cut, along with the chamfer cuts.  All posterior osteophytes were removed.  The flexion gap was then measured and was symmetric with the extension gap.  I completed the distal femoral preparation using the appropriate jig to prepare the box.  The patella was then measured, and cut with the saw.    The proximal tibia sized and prepared accordingly with the reamer and the punch, and then all components were trialed with the 10mm poly insert.  The knee was found to have excellent balance and full motion.    The above named components were then cemented into place and all excess cement was removed.  The trial polyethylene component was in place during cementation, and then was exchanged for the real polyethylene component.    The knee was easily taken through a range of motion and the patella tracked well, although it didn't want to come lateral in deep flexion, particularly when the retinaculum  was not held with a clamp.  The knee irrigated copiously and the parapatellar and subcutaneous tissue closed with vicryl, and monocryl with steri strips for the skin.  The wounds were injected with marcaine, and dressed with sterile gauze and the tourniquet released and the patient was awakened and returned to the PACU in  stable and satisfactory condition.  There were no complications.  Total tourniquet time was ~75 minutes.

## 2012-05-01 NOTE — Progress Notes (Signed)
UR COMPLETED  

## 2012-05-01 NOTE — Evaluation (Signed)
Physical Therapy Evaluation Patient Details Name: Christine Silva MRN: [id number] DOB: [birth year] Today's Date: 05/01/2012 Time: 4098-1191 PT Time Calculation (min): 25 min  PT Assessment / Plan / Recommendation Clinical Impression  Pt is a 67 y/o female s/p LTKA.  Pt willl benefit from PT in acute setting. Pt lives with her son who reports that he is unable to provide the assistance  that she currently requires.  Pt will benefit from continued PT in acute setting in preparation for short term SNF placement.                 PT Assessment  Patient needs continued PT services    Follow Up Recommendations  SNF;Supervision/Assistance - 24 hour    Does the patient have the potential to tolerate intense rehabilitation      Barriers to Discharge Decreased caregiver support      Equipment Recommendations  Rolling walker with 5" wheels    Recommendations for Other Services OT consult   Frequency 7X/week    Precautions / Restrictions Precautions Precautions: Knee Restrictions Weight Bearing Restrictions: Yes   Pertinent Vitals/Pain Pt c/o pain throughout session 9/10 per pt.  RN notified.         Mobility  Bed Mobility Bed Mobility: Supine to Sit;Sitting - Scoot to Edge of Bed Supine to Sit: 1: +2 Total assist;HOB elevated Supine to Sit: Patient Percentage: 30% Sitting - Scoot to Edge of Bed: 1: +2 Total assist Sitting - Scoot to Edge of Bed: Patient Percentage: 30% Details for Bed Mobility Assistance: Assist to initiate movement of LLE. Step by step verbal and tactile cues.   Transfers Transfers: Sit to Stand;Stand to Sit;Stand Pivot Transfers Sit to Stand: 2: Max assist;With upper extremity assist Stand to Sit: 1: +2 Total assist;To chair/3-in-1;With upper extremity assist Stand to Sit: Patient Percentage: 30% Stand Pivot Transfers: 1: +2 Total assist Stand Pivot Transfers: Patient Percentage: 20% Details for Transfer Assistance: Step by steps cues for technique. assist to  initiate WB in LLE, VCs for technique.   Ambulation/Gait Ambulation/Gait Assistance: Not tested (comment)    Exercises Total Joint Exercises Ankle Circles/Pumps: 10 reps;Both   PT Diagnosis: Difficulty walking;Generalized weakness;Acute pain  PT Problem List: Decreased strength;Decreased activity tolerance;Decreased mobility;Decreased knowledge of use of DME;Cardiopulmonary status limiting activity;Decreased range of motion;Decreased knowledge of precautions;Pain PT Treatment Interventions: Functional mobility training;Therapeutic activities;Therapeutic exercise;Patient/family education;DME instruction;Gait training   PT Goals Acute Rehab PT Goals PT Goal Formulation: With patient/family Time For Goal Achievement: 05/15/12 Potential to Achieve Goals: Good Pt will go Supine/Side to Sit: with supervision PT Goal: Supine/Side to Sit - Progress: Goal set today Pt will Sit at Edge of Bed: with supervision PT Goal: Sit at Edge Of Bed - Progress: Goal set today Pt will go Sit to Supine/Side: with supervision PT Goal: Sit to Supine/Side - Progress: Goal set today Pt will go Sit to Stand: with supervision PT Goal: Sit to Stand - Progress: Goal set today Pt will go Stand to Sit: with supervision PT Goal: Stand to Sit - Progress: Goal set today Pt will Transfer Bed to Chair/Chair to Bed: with supervision PT Transfer Goal: Bed to Chair/Chair to Bed - Progress: Goal set today Pt will Ambulate: 51 - 150 feet;with supervision;with least restrictive assistive device PT Goal: Ambulate - Progress: Goal set today Pt will Perform Home Exercise Program: with supervision, verbal cues required/provided PT Goal: Perform Home Exercise Program - Progress: Goal set today  Visit Information  Last PT Received On: 05/01/12 Assistance  Needed: +1    Subjective Data      Prior Functioning  Home Living Lives With: Son Available Help at Discharge: Skilled Nursing Facility Prior Function Level of  Independence: Independent Able to Take Stairs?: Yes Driving: No Vocation: Unemployed Communication Communication: Prefers language other than English ([place].)    Cognition  Cognition Overall Cognitive Status: Appears within functional limits for tasks assessed/performed Arousal/Alertness: Awake/alert Orientation Level: Appears intact for tasks assessed;Oriented X4 / Intact Behavior During Session: [facility] for tasks performed    Extremity/Trunk Assessment Right Lower Extremity Assessment RLE ROM/Strength/Tone: [facility] for tasks assessed Left Lower Extremity Assessment LLE ROM/Strength/Tone: Unable to fully assess;Due to precautions;Due to pain Trunk Assessment Trunk Assessment: Normal   Balance    End of Session PT - End of Session Equipment Utilized During Treatment: Gait belt;Left knee immobilizer Activity Tolerance: Patient tolerated treatment well Patient left: in chair;with call bell/phone within reach Nurse Communication: Mobility status  GP     Rutger Salton 05/01/2012, 4:48 PM  Christine Silva L. Christine Silva DPT [phone]

## 2012-05-01 NOTE — Anesthesia Procedure Notes (Addendum)
Procedure Name: LMA Insertion Date/Time: 05/01/2012 7:34 AM Performed by: Luster Landsberg Pre-anesthesia Checklist: Patient identified, Emergency Drugs available, Suction available and Patient being monitored Patient Re-evaluated:Patient Re-evaluated prior to inductionOxygen Delivery Method: Circle system utilized Preoxygenation: Pre-oxygenation with 100% oxygen Intubation Type: IV induction LMA: LMA with gastric port inserted LMA Size: 4.0 Number of attempts: 1 Placement Confirmation: positive ETCO2 and breath sounds checked- equal and bilateral Tube secured with: Tape Dental Injury: Teeth and Oropharynx as per pre-operative assessment    Anesthesia Regional Block:  Femoral nerve block  Pre-Anesthetic Checklist: ,, timeout performed, Correct Patient, Correct Site, Correct Laterality, Correct Procedure, Correct Position, site marked, Risks and benefits discussed,  Surgical consent,  Pre-op evaluation,  At surgeon's request and post-op pain management  Laterality: Left  Prep: Maximum Sterile Barrier Precautions used, chloraprep and alcohol swabs       Needles:  Injection technique: Single-shot  Needle Type: Stimulator Needle - 80        Needle insertion depth: 6 cm   Additional Needles:  Procedures: nerve stimulator Femoral nerve block  Nerve Stimulator or Paresthesia:  Response: 0.5 mA, 0.1 ms, 6 cm  Additional Responses:   Narrative:  Start time: 05/01/2012 7:20 AM End time: 05/01/2012 7:25 AM Injection made incrementally with aspirations every 5 mL.  Performed by: Personally  Anesthesiologist: Maren Beach MD  Additional Notes: Pt accept procedure and risks. 15cc 0.5% Marcaine w/ epi w/o difficulty or discomfort.

## 2012-05-01 NOTE — H&P (Signed)
PREOPERATIVE H&P  Chief Complaint: DJD LEFT KNEE  HPI: Christine Silva is a 67 y.o. female who presents for preoperative history and physical with a diagnosis of DJD LEFT KNEE. Symptoms are rated as moderate to severe, and have been worsening.  This is significantly impairing activities of daily living.  She has elected for surgical management. She has failed injections, activity modification, bracing, anti-inflammatories. She has had progressive deformity with loss of daily function.  Past Medical History  Diagnosis Date  . Hyperlipidemia   . Hypertension   . Diabetes mellitus   . Anemia   . Other specified disorder of rectum and anus   . Pneumonia Feb 2014  . Arthritis    Past Surgical History  Procedure Laterality Date  . Cesarean section    . Colonoscopy     History   Social History  . Marital Status: Single    Spouse Name: N/A    Number of Children: N/A  . Years of Education: N/A   Social History Main Topics  . Smoking status: Never Smoker   . Smokeless tobacco: Never Used  . Alcohol Use: No  . Drug Use: No  . Sexually Active: Not Currently   Other Topics Concern  . None   Social History Narrative  . None   Family History  Problem Relation Age of Onset  . Diabetes Mellitus II Mother    No Known Allergies Prior to Admission medications   Medication Sig Start Date End Date Taking? Authorizing Provider  aspirin 81 MG tablet Take 81 mg by mouth daily.   Yes Historical Provider, MD  ferrous sulfate 325 (65 FE) MG tablet Take 325 mg by mouth daily with breakfast.   Yes Historical Provider, MD  HYDROcodone-acetaminophen (NORCO/VICODIN) 5-325 MG per tablet Take 1 tablet by mouth every 6 (six) hours as needed for pain.    Yes Historical Provider, MD  insulin glargine (LANTUS) 100 UNIT/ML injection Inject 10 Units into the skin every morning. 12/22/11  Yes Ronnald Nian, MD  losartan-hydrochlorothiazide [facility]) 50-12.5 MG per tablet Take 1 tablet by mouth daily.  04/12/12  Yes Ronnald Nian, MD  meloxicam (MOBIC) 15 MG tablet Take 15 mg by mouth daily.   Yes Historical Provider, MD  metFORMIN (GLUCOPHAGE) 500 MG tablet Take 1 tablet (500 mg total) by mouth 2 (two) times daily with a meal. 12/22/11  Yes Ronnald Nian, MD  Multiple Vitamin (MULTIVITAMIN WITH MINERALS) TABS Take 1 tablet by mouth daily.   Yes Historical Provider, MD  pravastatin (PRAVACHOL) 40 MG tablet Take 1 tablet (40 mg total) by mouth daily. 04/29/11  Yes Ronnald Nian, MD     Positive ROS: All other systems have been reviewed and were otherwise negative with the exception of those mentioned in the HPI and as above.  Physical Exam: General: Alert, no acute distress Cardiovascular: No pedal edema Respiratory: No cyanosis, no use of accessory musculature GI: No organomegaly, abdomen is soft and non-tender Skin: No lesions in the area of chief complaint Neurologic: Sensation intact distally Psychiatric: Patient is competent for consent with normal mood and affect Lymphatic: No axillary or cervical lymphadenopathy  MUSCULOSKELETAL: Left knee has positive crepitus with varus alignment and pain with both active and passive motion. Motion is from 5 -115.  Assessment: DJD LEFT KNEE  Plan: Plan for Procedure(s): TOTAL KNEE ARTHROPLASTY  The risks benefits and alternatives were discussed with the patient including but not limited to the risks of nonoperative treatment, versus surgical  intervention including infection, bleeding, nerve injury,  blood clots, cardiopulmonary complications, morbidity, mortality, among others, and they were willing to proceed.   Tamar Miano P, MD Cell [phone] Pager [phone]  05/01/2012 7:20 AM

## 2012-05-01 NOTE — Transfer of Care (Signed)
Immediate Anesthesia Transfer of Care Note  Patient: Christine Silva  Procedure(s) Performed: Procedure(s): TOTAL KNEE ARTHROPLASTY (Left)  Patient Location: PACU  Anesthesia Type:General  Level of Consciousness: alert   Airway & Oxygen Therapy: Patient Spontanous Breathing and Patient connected to nasal cannula oxygen  Post-op Assessment: Report given to PACU RN, Post -op Vital signs reviewed and stable and Patient moving all extremities  Post vital signs: Reviewed and stable  Complications: No apparent anesthesia complications

## 2012-05-01 NOTE — Anesthesia Postprocedure Evaluation (Signed)
  Anesthesia Post-op Note  Patient: Christine Silva  Procedure(s) Performed: Procedure(s): TOTAL KNEE ARTHROPLASTY (Left)  Patient Location: PACU  Anesthesia Type:GA combined with regional for post-op pain  Level of Consciousness: awake, oriented, sedated and patient cooperative  Airway and Oxygen Therapy: Patient Spontanous Breathing  Post-op Pain: mild  Post-op Assessment: Post-op Vital signs reviewed, Patient's Cardiovascular Status Stable, Respiratory Function Stable, Patent Airway, No signs of Nausea or vomiting and Pain level controlled  Post-op Vital Signs: stable  Complications: No apparent anesthesia complications

## 2012-05-02 ENCOUNTER — Encounter (HOSPITAL_COMMUNITY): Payer: Self-pay | Admitting: Orthopedic Surgery

## 2012-05-02 LAB — CBC
HCT: 21 % — ABNORMAL LOW (ref 36.0–46.0)
Hemoglobin: 7.5 g/dL — ABNORMAL LOW (ref 12.0–15.0)
MCV: 93.8 fL (ref 78.0–100.0)
RDW: 15.9 % — ABNORMAL HIGH (ref 11.5–15.5)
WBC: 7.4 10*3/uL (ref 4.0–10.5)

## 2012-05-02 LAB — BASIC METABOLIC PANEL
BUN: 12 mg/dL (ref 6–23)
CO2: 29 mEq/L (ref 19–32)
Chloride: 100 mEq/L (ref 96–112)
Creatinine, Ser: 0.72 mg/dL (ref 0.50–1.10)
GFR calc Af Amer: >90 mL/min (ref >90)
Glucose, Bld: 167 mg/dL — ABNORMAL HIGH (ref 70–99)

## 2012-05-02 LAB — GLUCOSE, CAPILLARY
Glucose-Capillary: 220 mg/dL — ABNORMAL HIGH (ref 70–99)
Glucose-Capillary: 126 mg/dL — ABNORMAL HIGH (ref 70–99)

## 2012-05-02 MED ORDER — PNEUMOCOCCAL VAC POLYVALENT 25 MCG/0.5ML IJ INJ
0.5000 mL | INJECTION | INTRAMUSCULAR | Status: AC
  Filled 2012-05-02: qty 0.5

## 2012-05-02 NOTE — Progress Notes (Signed)
Seen and agreed 05/02/2012 Robinette, Julia Elizabeth PTA [phone] pager [phone] office    

## 2012-05-02 NOTE — Progress Notes (Signed)
Patient ID: Christine Silva, female   DOB: [birth year], 67 y.o.   MRN: [id number]     Subjective:  Patient reports pain as mild to moderate.  She states that she is doing well.  Only complains of pain in the knee.  Objective:   VITALS:   Filed Vitals:   05/01/12 1600 05/01/12 2205 05/02/12 0110 05/02/12 0528  BP:  105/40 101/41 119/51  Pulse:  83 65 68  Temp:  98.5 F (36.9 C) 98.2 F (36.8 C) 98.9 F (37.2 C)  TempSrc:  Oral Oral Oral  Resp: 16 16 16 16   SpO2: 99% 93% 95% 96%    ABD soft Sensation intact distally Dorsiflexion/Plantar flexion intact Incision: dressing C/D/I and no drainage   Lab Results  Component Value Date   WBC 7.4 05/02/2012   HGB 7.5* 05/02/2012   HCT 21.0* 05/02/2012   MCV 93.8 05/02/2012   PLT 312 05/02/2012     Assessment/Plan: 1 Day Post-Op   Principal Problem:   Osteoarthritis of left knee   Advance diet Up with therapy Plan 2 units of prbc's Continue foley until after blood transfusion ABLA  Torrie Mayers, Welcome Fults 05/02/2012, 8:12 AM   Teryl Lucy, MD Cell [phone]

## 2012-05-02 NOTE — Clinical Social Work Placement (Addendum)
Clinical Social Work Department  CLINICAL SOCIAL WORK PLACEMENT NOTE  05/02/2012  Patient: Christine Silva Account Number:  [id number]  Admit date: 05/01/12  Clinical Social Worker: Sabino Niemann MSW Date/time: 05/02/2012 10:30 AM  Clinical Social Work is seeking post-discharge placement for this patient at the following level of care: SKILLED NURSING (*CSW will update this form in Epic as items are completed)  05/02/2012 Patient/family provided with [facility] Department of Clinical Social Work's list of facilities offering this level of care within the geographic area requested by the patient (or if unable, by the patient's family).  05/02/2012 Patient/family informed of their freedom to choose among providers that offer the needed level of care, that participate in Medicare, Medicaid or managed care program needed by the patient, have an available bed and are willing to accept the patient.  4/16/2014Patient/family informed of [facility]' ownership interest in [facility], as well as of the fact that they are under no obligation to receive care at this facility.  PASARR submitted to EDS on  PASARR number received from EDS on  FL2 transmitted to all facilities in geographic area requested by pt/family on 05/02/2012  FL2 transmitted to all facilities within larger geographic area on  Patient informed that his/her managed care company has contracts with or will negotiate with certain facilities, including the following:  Patient/family informed of bed offers received: 05/02/12 Patient chooses bed at [facility] Physician recommends and patient chooses bed at  Patient to be transferred to on  05/04/2012 Patient to be transferred to facility by [facility] The following physician request were entered in Epic:  Additional Comments:

## 2012-05-02 NOTE — Care Management Note (Signed)
CARE MANAGEMENT NOTE 05/02/2012  Patient:  Christine Silva, Christine Silva   Account Number:  [id number]  Date Initiated:  05/02/2012  Documentation initiated by:  Vance Peper  Subjective/Objective Assessment:   67 yr old female s/p left total knee arthroplasty.     Action/Plan:   CM spoke with patient concerning discharge plans. Patient will require shortterm rehab at [facility]. Social worker is aware.   Anticipated DC Date:  05/03/2012   Anticipated DC Plan:  SKILLED NURSING FACILITY  In-house referral  Clinical Social Worker      DC Planning Services  CM consult      [facility] Choice  NA   Choice offered to / List presented to:             Status of service:  Completed, signed off Medicare Important Message given?   (If response is "NO", the following Medicare IM given date fields will be blank) Date Medicare IM given:   Date Additional Medicare IM given:    Discharge Disposition:  SKILLED NURSING FACILITY  Per UR Regulation:    If discussed at Long Length of Stay Meetings, dates discussed:    Comments:

## 2012-05-02 NOTE — Progress Notes (Signed)
Physical Therapy Treatment Patient Details Name: Christine Silva MRN: [id number] DOB: 06/23/1945 Today's Date: 05/02/2012 Time: 4098-1191 PT Time Calculation (min): 26 min  PT Assessment / Plan / Recommendation Comments on Treatment Session  Patient increased ambulation and independence with transfers.  Patient reported pain during ther ex.  Encouraged her to continue.    Follow Up Recommendations  SNF;Supervision/Assistance - 24 hour     Does the patient have the potential to tolerate intense rehabilitation     Barriers to Discharge        Equipment Recommendations  Rolling walker with 5" wheels    Recommendations for Other Services    Frequency 7X/week   Plan Discharge plan remains appropriate;Frequency remains appropriate    Precautions / Restrictions Precautions Precautions: Knee Precaution Booklet Issued: No Required Braces or Orthoses: Knee Immobilizer - Left Restrictions Weight Bearing Restrictions: Yes   Pertinent Vitals/Pain 5/10 L Knee pain.    Mobility  Transfers Transfers: Sit to Stand;Stand to Sit Sit to Stand: 4: Min assist;With upper extremity assist;With armrests;From chair/3-in-1 Stand to Sit: 4: Min assist;To chair/3-in-1;With upper extremity assist;With armrests Details for Transfer Assistance: Cues for technique and assistance to initiate and control descent. Ambulation/Gait Ambulation/Gait Assistance: 4: Min guard Ambulation Distance (Feet): 10 Feet Assistive device: Rolling walker Ambulation/Gait Assistance Details: Cues for technique and min guard for safety as patient is anxious. Gait Pattern: Step-to pattern;Decreased step length - right;Decreased step length - left;Decreased stance time - left Gait velocity: Decreased. Stairs: No    Exercises Total Joint Exercises Quad Sets: AROM;Left;10 reps Heel Slides: AROM;Left;10 reps Straight Leg Raises: AROM;Left;10 reps   PT Diagnosis:    PT Problem List:   PT Treatment Interventions:     PT  Goals Acute Rehab PT Goals PT Goal: Sit to Stand - Progress: Progressing toward goal PT Goal: Stand to Sit - Progress: Progressing toward goal PT Goal: Ambulate - Progress: Progressing toward goal PT Goal: Perform Home Exercise Program - Progress: Progressing toward goal  Visit Information  Last PT Received On: 05/02/12 Assistance Needed: +1    Subjective Data      Cognition  Cognition Arousal/Alertness: Awake/alert Behavior During Therapy: WFL for tasks assessed/performed Overall Cognitive Status: Within Functional Limits for tasks assessed    Balance     End of Session PT - End of Session Equipment Utilized During Treatment: Gait belt;Left knee immobilizer Activity Tolerance: Patient tolerated treatment well Patient left: in chair;with call bell/phone within reach   GP     [facility], [facility] JEAN 05/02/2012, 11:50 AM

## 2012-05-02 NOTE — Progress Notes (Signed)
ANTICOAGULATION CONSULT NOTE   Pharmacy Consult for Coumadin Indication: VTE prophylaxis  No Known Allergies  Medical History: Past Medical History  Diagnosis Date  . Hyperlipidemia   . Hypertension   . Diabetes mellitus   . Anemia   . Other specified disorder of rectum and anus   . Pneumonia Feb 2014  . Arthritis   . Osteoarthritis of left knee 05/01/2012   Assessment: 67 year old female beginning Coumadin for VTE prophylaxis.  INR = 1.39 today, CBC low with 2 units PRBCs planned  Goal of Therapy:  INR 2-3 Monitor platelets by anticoagulation protocol: Yes   Plan:  1) Coumadin 5 mg po daily at 1800 pm 2) Daily INR  Thank you. Okey Regal, PharmD [phone]  05/02/2012,8:40 AM

## 2012-05-02 NOTE — Evaluation (Signed)
Occupational Therapy Evaluation Patient Details Name: Christine Silva MRN: [id number] DOB: April 10, 1945 Today's Date: 05/02/2012 Time: 1137-1209 OT Time Calculation (min): 32 min  OT Assessment / Plan / Recommendation Clinical Impression  Pleasant 67 yr old female admitted for elective LTKA.  Pt overall min to mod assist for LB selfcare and toilet transfers.  Will benefit from coninued OT at SNF prior to home.  Will defer OT treatment to SNF.    OT Assessment  All further OT needs can be met in the next venue of care    Follow Up Recommendations  SNF       Equipment Recommendations  3 in 1 bedside comode          Precautions / Restrictions Precautions Precautions: Knee Precaution Booklet Issued: No Required Braces or Orthoses: Knee Immobilizer - Left Restrictions Weight Bearing Restrictions: No   Pertinent Vitals/Pain Pain 6/10 nursing notified    ADL  Eating/Feeding: Simulated;Independent Where Assessed - Eating/Feeding: Chair Grooming: Simulated;Minimal assistance Where Assessed - Grooming: Supported standing Upper Body Bathing: Simulated;Set up Where Assessed - Upper Body Bathing: Unsupported sitting Lower Body Bathing: Simulated;Moderate assistance Where Assessed - Lower Body Bathing: Supported sit to stand Upper Body Dressing: Simulated;Set up Where Assessed - Upper Body Dressing: Unsupported sitting Lower Body Dressing: Simulated;Maximal assistance Where Assessed - Lower Body Dressing: Supported sit to stand Toilet Transfer: Performed;Moderate assistance Toilet Transfer Equipment: Bedside commode Toileting - Clothing Manipulation and Hygiene: Simulated;Moderate assistance Where Assessed - Toileting Clothing Manipulation and Hygiene: Sit to stand from 3-in-1 or toilet Equipment Used: Rolling walker;Knee Immobilizer;Gait belt Transfers/Ambulation Related to ADLs: Pt able to ambulate with mod assist with the RW. ADL Comments: Pt with decreased ability to reach either  foot for dressing tasks or bathing.  Plan for SNF for follow-up rehab secondary to pt not having 24 hour supervision    OT Diagnosis: Generalized weakness;Acute pain  OT Problem List: Decreased strength;Decreased activity tolerance;Impaired balance (sitting and/or standing);Decreased knowledge of use of DME or AE;Pain   Visit Information  Last OT Received On: 05/02/12 Assistance Needed: +1    Subjective Data  Subjective: I don't have to get up again do I? Patient Stated Goal: Pt did not state during session   Prior Functioning     Home Living Lives With: Son Available Help at Discharge: Skilled Nursing Facility Bathroom Shower/Tub: Tub/shower unit Prior Function Level of Independence: Independent Able to Take Stairs?: Yes Driving: No Vocation: Unemployed Musician: Prefers language other than English ([place].)         Vision/Perception Vision - History Baseline Vision: No visual deficits Patient Visual Report: No change from baseline Vision - Assessment Eye Alignment: Within Functional Limits Vision Assessment: Vision not tested Perception Perception: Within Functional Limits Praxis Praxis: Intact   Cognition  Cognition Arousal/Alertness: Awake/alert Behavior During Therapy: WFL for tasks assessed/performed Overall Cognitive Status: Within Functional Limits for tasks assessed    Extremity/Trunk Assessment Right Upper Extremity Assessment RUE ROM/Strength/Tone: Within functional levels RUE Sensation: WFL - Light Touch RUE Coordination: WFL - gross/fine motor Left Upper Extremity Assessment LUE ROM/Strength/Tone: Within functional levels LUE Sensation: WFL - Light Touch LUE Coordination: WFL - gross/fine motor Trunk Assessment Trunk Assessment: Normal     Mobility Transfers Transfers: Sit to Stand Sit to Stand: 4: Min assist;With upper extremity assist;From chair/3-in-1 Stand to Sit: 4: Min assist;With upper extremity assist;To  chair/3-in-1 Details for Transfer Assistance: Cues for technique and assistance to initiate and control descent.        Balance  Balance Balance Assessed: Yes Static Standing Balance Static Standing - Balance Support: Bilateral upper extremity supported Static Standing - Level of Assistance: 4: Min assist   End of Session OT - End of Session Equipment Utilized During Treatment: Gait belt;Right knee immobilizer Activity Tolerance: Patient limited by pain Patient left: in chair;with call bell/phone within reach;with nursing in room Nurse Communication: Mobility status     Christine Silva OTR/L Pager number [phone] 05/02/2012, 2:02 PM

## 2012-05-02 NOTE — Progress Notes (Signed)
Rept to [facility] for Dr. Dion Saucier. Pt is getting up with no dizziness or weakness. Pt vital signs are stable. Pt is voiding on BSC with one assist. Per order, due to pt vital signs being stable and asymptomatic with low hgb-will hold transfusion for now and re check HGB in AM and change plan of care as indicated.

## 2012-05-03 DIAGNOSIS — D62 Acute posthemorrhagic anemia: Secondary | ICD-10-CM | POA: Diagnosis present

## 2012-05-03 LAB — GLUCOSE, CAPILLARY
Glucose-Capillary: 219 mg/dL — ABNORMAL HIGH (ref 70–99)
Glucose-Capillary: 188 mg/dL — ABNORMAL HIGH (ref 70–99)
Glucose-Capillary: 178 mg/dL — ABNORMAL HIGH (ref 70–99)

## 2012-05-03 LAB — URINALYSIS, ROUTINE W REFLEX MICROSCOPIC
Glucose, UA: NEGATIVE mg/dL
Ketones, ur: NEGATIVE mg/dL
Leukocytes, UA: NEGATIVE
Nitrite: NEGATIVE
Protein, ur: 100 mg/dL — AB
Urobilinogen, UA: 0.2 mg/dL (ref 0.0–1.0)

## 2012-05-03 LAB — CBC
HCT: 19.9 % — ABNORMAL LOW (ref 36.0–46.0)
MCH: 32.7 pg (ref 26.0–34.0)
MCHC: 35.2 g/dL (ref 30.0–36.0)
MCV: 93 fL (ref 78.0–100.0)
RDW: 15.4 % (ref 11.5–15.5)

## 2012-05-03 LAB — URINE MICROSCOPIC-ADD ON

## 2012-05-03 NOTE — Progress Notes (Signed)
Patient is receiving 2 units of blood today will likely d/c to SNF tomorrow in the am.  Sabino Niemann, MSW, [phone]

## 2012-05-03 NOTE — Progress Notes (Signed)
Clinical social worker assisted with patient discharge to skilled nursing facility, [facility].  CSW addressed all family questions and concerns. CSW copied chart and added all important documents. CSW also set up patient transportation with [employer]. Clinical Social Worker will sign off for now as social work intervention is no longer needed.

## 2012-05-03 NOTE — Clinical Social Work Psychosocial (Signed)
Clinical Social Work Department  BRIEF PSYCHOSOCIAL ASSESSMENT  Patient: Christine Silva  Account Number: [id number]  Admit date: 05/01/12 Clinical Social Worker Sabino Niemann, MSW Date/Time: 05/01/12 1100 AM Referred by: Physician Date Referred:  Referred for   SNF Placement   Other Referral:  Interview type: Patient and patient's son Other interview type: PSYCHOSOCIAL DATA  Living Status: Alone Admitted from facility:  Level of care:  Primary support name: Brett Canales Content Primary support relationship to patient: Son Degree of support available:  Strong and vested  CURRENT CONCERNS  Current Concerns   Post-Acute Placement   Other Concerns:  SOCIAL WORK ASSESSMENT / PLAN  CSW met with pt re: PT recommendation for SNF.   Pt lives alone and speaks little english. Son was available to help coverse  CSW explained placement process and answered questions.   Pt reports [facility]  as her preference    CSW completed FL2 and initiated SNF search.     Assessment/plan status: Information/Referral to [facility]  Other assessment/ plan:  Information/referral to community resources:  SNF     PATIENT'S/FAMILY'S RESPONSE TO PLAN OF CARE:  Pt and patient's son  report they are  agreeable to ST SNF in order to increase strength and independence with mobility prior to returning home  Pt and patient's son verbalized understanding of placement process and appreciation for CSW assist.   Sabino Niemann, MSW [phone]

## 2012-05-03 NOTE — Progress Notes (Signed)
Physical Therapy Treatment Patient Details Name: Christine Silva MRN: [id number] DOB: 05-10-1945 Today's Date: 05/03/2012 Time: 5784-6962 PT Time Calculation (min): 23 min  PT Assessment / Plan / Recommendation Comments on Treatment Session  Pt. weak feeling while supine in bed; dizzy when sitting upright and unable to fully participate.  Rn [place] Citizen made aware and she is to call MD.  Pt. hgb at 7.0.    Follow Up Recommendations  SNF;Supervision/Assistance - 24 hour     Does the patient have the potential to tolerate intense rehabilitation     Barriers to Discharge        Equipment Recommendations  Rolling walker with 5" wheels    Recommendations for Other Services    Frequency 7X/week   Plan Discharge plan remains appropriate;Frequency remains appropriate    Precautions / Restrictions Precautions Precautions: Knee Required Braces or Orthoses: Knee Immobilizer - Left Restrictions Weight Bearing Restrictions: Yes LLE Weight Bearing: Weight bearing as tolerated   Pertinent Vitals/Pain See vitals tab; pt. Dizzy when in sitting position at edge of bed.    Mobility  Bed Mobility Bed Mobility: Supine to Sit;Sitting - Scoot to Edge of Bed Supine to Sit: 3: Mod assist;HOB elevated;With rails Supine to Sit: Patient Percentage: 50% Sitting - Scoot to Edge of Bed: 3: Mod assist Details for Bed Mobility Assistance: Assist to initiate movement of LLE. Step by step verbal and tactile cues.   Transfers Transfers: Sit to Stand;Stand to Sit Sit to Stand: 4: Min assist;With upper extremity assist;From bed Stand to Sit: 4: Min assist;With upper extremity assist;To bed Ambulation/Gait Ambulation/Gait Assistance: Not tested (comment);Other (comment) (pt. with dizziness and low hgb of 7.0)    Exercises Total Joint Exercises Ankle Circles/Pumps: AROM;10 reps;Both;Supine Quad Sets: AROM;Left;10 reps;Supine Heel Slides: AAROM;Left;10 reps;Supine Straight Leg Raises: AAROM;Left;5 reps;Supine    PT Diagnosis:    PT Problem List:   PT Treatment Interventions:     PT Goals Acute Rehab PT Goals Pt will go Supine/Side to Sit: with supervision PT Goal: Supine/Side to Sit - Progress: Progressing toward goal Pt will Sit at [facility] of Bed: with supervision PT Goal: Sit at [facility] Of Bed - Progress: Progressing toward goal Pt will go Sit to Supine/Side: with supervision PT Goal: Sit to Supine/Side - Progress: Progressing toward goal Pt will go Sit to Stand: with supervision PT Goal: Sit to Stand - Progress: Progressing toward goal Pt will go Stand to Sit: with supervision PT Goal: Stand to Sit - Progress: Progressing toward goal Pt will Perform Home Exercise Program: with supervision, verbal cues required/provided PT Goal: Perform Home Exercise Program - Progress: Progressing toward goal  Visit Information  Last PT Received On: 05/03/12 Assistance Needed: +1    Subjective Data  Subjective: When pt. questioned as to how she is doing, she stated "I feel weak"   Cognition  Cognition Arousal/Alertness: Awake/alert Behavior During Therapy: WFL for tasks assessed/performed Overall Cognitive Status: Within Functional Limits for tasks assessed    Balance     End of Session PT - End of Session Equipment Utilized During Treatment: Gait belt;Left knee immobilizer Activity Tolerance: Patient limited by pain Patient left: in bed;with call bell/phone within reach Nurse Communication: Mobility status;Other (comment) (symptomatic for weakness and dizziness; RN to call MD)   GP     Ferman Hamming 05/03/2012, 10:22 AM Weldon Picking PT Acute Rehab Services [phone] Beeper [phone]

## 2012-05-03 NOTE — Discharge Summary (Signed)
Physician Discharge Summary  Patient ID: Christine Silva MRN: [id number] DOB/AGE: 03/06/1945 67 y.o.  Admit date: 05/01/2012 Discharge date: 05/03/2012  Admission Diagnoses:  Osteoarthritis of left knee  Discharge Diagnoses:  Principal Problem:   Osteoarthritis of left knee Active Problems:   Diabetes mellitus due to underlying condition with diabetic neuropathy   Acute blood loss anemia   Past Medical History  Diagnosis Date  . Hyperlipidemia   . Hypertension   . Diabetes mellitus   . Anemia   . Other specified disorder of rectum and anus   . Pneumonia Feb 2014  . Arthritis   . Osteoarthritis of left knee 05/01/2012    Surgeries: Procedure(s): TOTAL KNEE ARTHROPLASTY on 05/01/2012   Consultants (if any):    Discharged Condition: Improved  Hospital Course: Christine Silva is an 67 y.o. female who was admitted 05/01/2012 with a diagnosis of Osteoarthritis of left knee and went to the operating room on 05/01/2012 and underwent the above named procedures.  She did have blood loss anemia, although she started out anemic. She had absolutely no symptoms, and therefore we did not perform a blood transfusion.  She was given perioperative antibiotics:  Anti-infectives   Start     Dose/Rate Route Frequency Ordered Stop   05/02/12 0600  ceFAZolin (ANCEF) IVPB 2 g/50 mL premix  Status:  Discontinued     2 g 100 mL/hr over 30 Minutes Intravenous On call to O.R. 05/01/12 1220 05/01/12 1223   05/01/12 2000  ceFAZolin (ANCEF) IVPB 2 g/50 mL premix     2 g 100 mL/hr over 30 Minutes Intravenous 4 times per day 05/01/12 1842 05/02/12 0035   05/01/12 1230  ceFAZolin (ANCEF) IVPB 2 g/50 mL premix     2 g 100 mL/hr over 30 Minutes Intravenous Every 6 hours 05/01/12 1221 05/01/12 1430    .  She was given sequential compression devices, early ambulation, and Lovenox bridging to Coumadin for DVT prophylaxis.  She benefited maximally from the hospital stay and there were no complications.     Recent vital signs:  Filed Vitals:   05/03/12 0604  BP: 134/65  Pulse: 60  Temp: 98.9 F (37.2 C)  Resp: 18    Recent laboratory studies:  Lab Results  Component Value Date   HGB 7.0* 05/03/2012   HGB 7.5* 05/02/2012   HGB 8.7* 05/01/2012   Lab Results  Component Value Date   WBC 9.4 05/03/2012   PLT 373 05/03/2012   Lab Results  Component Value Date   INR 1.59* 05/03/2012   Lab Results  Component Value Date   NA 135 05/02/2012   K 3.9 05/02/2012   CL 100 05/02/2012   CO2 29 05/02/2012   BUN 12 05/02/2012   CREATININE 0.72 05/02/2012   GLUCOSE 167* 05/02/2012    Discharge Medications:     Medication List    STOP taking these medications       HYDROcodone-acetaminophen 5-325 MG per tablet  Commonly known as:  NORCO/VICODIN     meloxicam 15 MG tablet  Commonly known as:  MOBIC      TAKE these medications       aspirin 81 MG tablet  Take 81 mg by mouth daily.     ferrous sulfate 325 (65 FE) MG tablet  Take 325 mg by mouth daily with breakfast.     insulin glargine 100 UNIT/ML injection  Commonly known as:  LANTUS  Inject 10 Units into the skin every morning.  losartan-hydrochlorothiazide 50-12.5 MG per tablet  Commonly known as:  HYZAAR  Take 1 tablet by mouth daily.     metFORMIN 500 MG tablet  Commonly known as:  GLUCOPHAGE  Take 1 tablet (500 mg total) by mouth 2 (two) times daily with a meal.     multivitamin with minerals Tabs  Take 1 tablet by mouth daily.     oxyCODONE-acetaminophen 10-325 MG per tablet  Commonly known as:  PERCOCET  Take 1-2 tablets by mouth every 6 (six) hours as needed for pain. MAXIMUM TOTAL ACETAMINOPHEN DOSE IS 4000 MG PER DAY     pravastatin 40 MG tablet  Commonly known as:  PRAVACHOL  Take 1 tablet (40 mg total) by mouth daily.     sennosides-docusate sodium 8.6-50 MG tablet  Commonly known as:  SENOKOT-S  Take 1 tablet by mouth daily.     warfarin 5 MG tablet  Commonly known as:  COUMADIN  Take 1 tablet (5  mg total) by mouth daily.        Diagnostic Studies: Dg Chest 2 View  05/01/2012  *RADIOLOGY REPORT*  Clinical Data: Preoperative chest radiograph for knee surgery.  CHEST - 2 VIEW  Comparison: Chest radiograph performed 03/06/2012  Findings: The lungs are well-aerated.  Previously noted pleural effusions have resolved.  Pulmonary vascularity is at the upper limits of normal.  Chronically increased interstitial markings are noted.  No pneumothorax is seen.  The heart is borderline normal in size; the mediastinal contour is within normal limits.  No acute osseous abnormalities are seen.  IMPRESSION: Previously noted pleural effusions have resolved.  Mild chronic lung changes noted; lungs otherwise clear.   Original Report Authenticated By: Tonia Ghent, M.D.    Dg Knee Left Port  05/01/2012  *RADIOLOGY REPORT*  Clinical Data: Post arthroplasty.  PORTABLE LEFT KNEE - 1-2 VIEW  Comparison: 04/29/2011.  Findings: Portable AP and lateral images are submitted.  The left knee arthroplasty has been performed.  There is expected alignment of the hardware components with no evidence of dislocation or destruction.  Edema and air are present at the operative sites.  IMPRESSION: Post left knee arthroplasty.  Expected alignment.  No evidence of disruption of hardware.   Original Report Authenticated By: Onalee Hua Call     Disposition: 01-Home or Self Care      Discharge Orders   Future Orders Complete By Expires     Call MD / Call 911  As directed     Comments:      If you experience chest pain or shortness of breath, CALL 911 and be transported to the hospital emergency room.  If you develope a fever above 101 F, pus (white drainage) or increased drainage or redness at the wound, or calf pain, call your surgeon's office.    Change dressing  As directed     Comments:      Change dressing in three days, then change the dressing daily with sterile 4 x 4 inch gauze dressing.  You may clean the incision with  alcohol prior to redressing.    Constipation Prevention  As directed     Comments:      Drink plenty of fluids.  Prune juice may be helpful.  You may use a stool softener, such as Colace (over the counter) 100 mg twice a day.  Use MiraLax (over the counter) for constipation as needed.    Diet general  As directed     Discharge instructions  As directed  Comments:      Change dressing in 3 days and reapply fresh dressing, unless you have a splint (half cast).  If you have a splint/cast, just leave in place until your follow-up appointment.    Keep wounds dry for 3 weeks.  Leave steri-strips in place on skin.  Do not apply lotion or anything to the wound.    Do not put a pillow under the knee. Place it under the heel.  As directed     TED hose  As directed     Comments:      Use stockings (TED hose) for 2 weeks on both leg(s).  You may remove them at night for sleeping.    Weight bearing as tolerated  As directed        Follow-up Information   Follow up with Eulas Post, MD. Schedule an appointment as soon as possible for a visit in 2 weeks.   Contact information:   [address] [place] [address] [phone]        Signed: Eulas Post 05/03/2012, 7:43 AM

## 2012-05-03 NOTE — Progress Notes (Signed)
     Subjective:  Patient reports pain as moderate, and she denies any lightheadedness or dizziness. She overall feels well.  Objective:   VITALS:   Filed Vitals:   05/02/12 1335 05/02/12 1600 05/02/12 2303 05/03/12 0604  BP: 135/55  154/71 134/65  Pulse: 95  88 60  Temp: 98.1 F (36.7 C)  98.8 F (37.1 C) 98.9 F (37.2 C)  TempSrc:      Resp: 17 18 18 18   SpO2: 100% 98% 100% 100%   Left knee has knee immobilizer in place. Dressings are clean. Sensation is intact at the foot. EHL and FHL are firing.   Lab Results  Component Value Date   WBC 9.4 05/03/2012   HGB 7.0* 05/03/2012   HCT 19.9* 05/03/2012   MCV 93.0 05/03/2012   PLT 373 05/03/2012     Assessment/Plan: 2 Days Post-Op   Principal Problem:   Osteoarthritis of left knee Active Problems:   Diabetes mellitus due to underlying condition with diabetic neuropathy   Acute blood loss anemia  She was anemic preoperatively, and her hemoglobin seems to be reasonably stable. She is not having any symptoms. We have discussed the option of a blood transfusion, but she wants to forego this for now. She clinically looks fine.  She is going to plan for discharge to skilled nursing facility either later today or tomorrow, depending on bed availability and authorization.   Breuna Loveall P 05/03/2012, 7:41 AM   Teryl Lucy, MD Cell [phone]

## 2012-05-04 LAB — GLUCOSE, CAPILLARY

## 2012-05-04 LAB — CBC
Hemoglobin: 9.1 g/dL — ABNORMAL LOW (ref 12.0–15.0)
MCH: 32.4 pg (ref 26.0–34.0)
MCHC: 36.1 g/dL — ABNORMAL HIGH (ref 30.0–36.0)
Platelets: 341 10*3/uL (ref 150–400)

## 2012-05-04 LAB — PROTIME-INR
INR: 1.77 — ABNORMAL HIGH (ref 0.00–1.49)
Prothrombin Time: 20 seconds — ABNORMAL HIGH (ref 11.6–15.2)

## 2012-05-04 NOTE — Progress Notes (Signed)
ANTICOAGULATION CONSULT NOTE - Follow Up Consult  Pharmacy Consult:  Coumadin Indication:  VTE prophylaxis  No Known Allergies  Patient Measurements: Height: 5\' 3"  (160 cm) Weight: 153 lb 1.4 oz (69.44 kg) IBW/kg (Calculated) : 52.4  Vital Signs: Temp: 99.2 F (37.3 C) (04/18 0537) Temp src: Oral (04/18 0537) BP: 112/53 mmHg (04/18 0537) Pulse Rate: 67 (04/18 0537)  Labs:  Recent Labs  05/01/12 1408 05/02/12 0610 05/03/12 0508 05/04/12 0600  HGB 8.7* 7.5* 7.0* 9.1*  HCT 24.2* 21.0* 19.9* 25.2*  PLT 360 312 373 341  LABPROT  --  16.7* 18.5* 20.0*  INR  --  1.39 1.59* 1.77*  CREATININE 0.57 0.72  --   --     Estimated Creatinine Clearance: 64.6 ml/min (by C-G formula based on Cr of 0.72).       Assessment: 67 YOF on Coumadin for VTE px s/p TKA.  INR sub-therapeutic but is trending up nicely.  Patient continues on Lovenox bridge and could likely discontinue at discharge as expect INR to be above 1.8 by tomorrow.  Renal function stable; no bleeding reported although patient did receive transfusion.   Goal of Therapy:  INR 2-3 Monitor platelets by anticoagulation protocol: Yes    Plan:  - Repeat Coumadin 5mg  PO today.  Noted d/c patient on Coumadin 5mg  PO daily - agree.  Recommend INR check on Monday 05/07/12. - Continue Lovenox until INR >/= 1.8 per MD order - Daily PT / INR     Tyliyah Mcmeekin D. Laney Potash, PharmD, BCPS Pager:  [phone] 05/04/2012, 9:53 AM

## 2012-05-04 NOTE — Progress Notes (Signed)
Subjective: 3 Days Post-Op Procedure(s) (LRB): TOTAL KNEE ARTHROPLASTY (Left) Patient reports pain as mild.    Objective: Vital signs in last 24 hours: Temp:  [98.3 F (36.8 C)-101.3 F (38.5 C)] 99.2 F (37.3 C) (04/18 0537) Pulse Rate:  [64-115] 67 (04/18 0537) Resp:  [13-20] 16 (04/18 0537) BP: (100-151)/(42-67) 112/53 mmHg (04/18 0537) SpO2:  [93 %-100 %] 98 % (04/18 0537) Weight:  [69.44 kg (153 lb 1.4 oz)] 69.44 kg (153 lb 1.4 oz) (04/18 0843)  Intake/Output from previous day: 04/17 0701 - 04/18 0700 In: 1884 [P.O.:360; I.V.:1011.5; Blood:512.5] Out: -  Intake/Output this shift: Total I/O In: 120 [P.O.:120] Out: -    Recent Labs  05/01/12 1408 05/02/12 0610 05/03/12 0508 05/04/12 0600  HGB 8.7* 7.5* 7.0* 9.1*    Recent Labs  05/03/12 0508 05/04/12 0600  WBC 9.4 9.9  RBC 2.14* 2.81*  HCT 19.9* 25.2*  PLT 373 341    Recent Labs  05/01/12 1408 05/02/12 0610  NA  --  135  K  --  3.9  CL  --  100  CO2  --  29  BUN  --  12  CREATININE 0.57 0.72  GLUCOSE  --  167*  CALCIUM  --  9.0    Recent Labs  05/03/12 0508 05/04/12 0600  INR 1.59* 1.77*    Neurovascular intact Sensation intact distally Intact pulses distally Dorsiflexion/Plantar flexion intact Incision: dressing C/D/I and scant drainage No cellulitis present Compartment soft Dressing changed  Assessment/Plan: 3 Days Post-Op Procedure(s) (LRB): TOTAL KNEE ARTHROPLASTY (Left) Up with therapy Discharge to SNF  Christine Silva 05/04/2012, 10:41 AM

## 2012-05-04 NOTE — Progress Notes (Signed)
Physical Therapy Treatment Patient Details Name: Christine Silva MRN: [id number] DOB: July 31, 1945 Today's Date: 05/04/2012 Time: 0802-0825 PT Time Calculation (min): 23 min  PT Assessment / Plan / Recommendation Comments on Treatment Session  Pt. appears much brighter and more energetic today.  No c/o weakness and able to tolerate ambulation today.      Follow Up Recommendations  SNF;Supervision/Assistance - 24 hour     Does the patient have the potential to tolerate intense rehabilitation     Barriers to Discharge        Equipment Recommendations  Rolling walker with 5" wheels    Recommendations for Other Services    Frequency 7X/week   Plan Discharge plan remains appropriate;Frequency remains appropriate    Precautions / Restrictions Precautions Precautions: Knee Precaution Booklet Issued: Yes (comment) Required Braces or Orthoses: Knee Immobilizer - Left Knee Immobilizer - Left: Discontinue post op day 2 (pt. now POD 4) Restrictions Weight Bearing Restrictions: Yes LLE Weight Bearing: Weight bearing as tolerated   Pertinent Vitals/Pain See vitals tab for pain info; no dizziness today    Mobility  Bed Mobility Bed Mobility: Sit to Supine Sit to Supine: 3: Mod assist Details for Bed Mobility Assistance: mod assist needed to lift L LE up into bed as pt. not managing it yet Transfers Transfers: Sit to Stand;Stand to Sit Sit to Stand: 4: Min assist;With upper extremity assist;From chair/3-in-1 Stand to Sit: 4: Min assist;With upper extremity assist;To chair/3-in-1;To bed Details for Transfer Assistance: cues for hand placement and technique Ambulation/Gait Ambulation/Gait Assistance: 4: Min assist Ambulation Distance (Feet): 20 Feet Assistive device: Rolling walker Ambulation/Gait Assistance Details: cues for sequence and placement of RW, as she tends to place it too far forward Gait Pattern: Step-through pattern;Decreased step length - right;Decreased step length -  left;Decreased stance time - left Gait velocity: Decreased. Stairs: No    Exercises Total Joint Exercises Ankle Circles/Pumps: AROM;10 reps;Both;Supine Quad Sets: AROM;Left;10 reps;Supine Heel Slides: AAROM;Left;10 reps;Seated Straight Leg Raises: AAROM;Left;5 reps;Supine   PT Diagnosis:    PT Problem List:   PT Treatment Interventions:     PT Goals Acute Rehab PT Goals Pt will go Sit to Supine/Side: with supervision PT Goal: Sit to Supine/Side - Progress: Progressing toward goal Pt will go Sit to Stand: with supervision PT Goal: Sit to Stand - Progress: Progressing toward goal Pt will go Stand to Sit: with supervision PT Goal: Stand to Sit - Progress: Progressing toward goal Pt will Ambulate: 51 - 150 feet;with supervision;with least restrictive assistive device PT Goal: Ambulate - Progress: Progressing toward goal Pt will Perform Home Exercise Program: with supervision, verbal cues required/provided PT Goal: Perform Home Exercise Program - Progress: Progressing toward goal  Visit Information  Last PT Received On: 05/04/12 Assistance Needed: +1    Subjective Data  Subjective: Pt. reports feeling stronger and with no dizziness today during session   Cognition  Cognition Arousal/Alertness: Awake/alert Behavior During Therapy: WFL for tasks assessed/performed Overall Cognitive Status: Within Functional Limits for tasks assessed    Balance     End of Session PT - End of Session Equipment Utilized During Treatment: Gait belt Activity Tolerance: Patient limited by pain Patient left: in bed;with call bell/phone within reach Nurse Communication: Mobility status;Patient requests pain meds   GP     Ferman Hamming 05/04/2012, 8:36 AM Weldon Picking PT Acute Rehab Services [phone] [place] [phone]

## 2012-05-04 NOTE — Progress Notes (Signed)
Pt discharged to [facility] place via EMS; pt is aware and ready for discharge; report called to [facility], spoke to [place], RN who stated understanding and denied questions/concerns; pt stable at time of discharge; awaiting transport at this time

## 2012-05-05 LAB — TYPE AND SCREEN
Unit division: 0
Unit division: 0

## 2012-05-07 LAB — TYPE AND SCREEN
PT AG Type: POSITIVE
Unit division: 0
Unit division: 0

## 2012-05-08 ENCOUNTER — Non-Acute Institutional Stay (SKILLED_NURSING_FACILITY): Payer: Medicare Other | Admitting: Internal Medicine

## 2012-05-08 DIAGNOSIS — M1710 Unilateral primary osteoarthritis, unspecified knee: Secondary | ICD-10-CM

## 2012-05-08 DIAGNOSIS — E1149 Type 2 diabetes mellitus with other diabetic neurological complication: Secondary | ICD-10-CM

## 2012-05-08 DIAGNOSIS — I10 Essential (primary) hypertension: Secondary | ICD-10-CM

## 2012-05-08 DIAGNOSIS — D62 Acute posthemorrhagic anemia: Secondary | ICD-10-CM

## 2012-05-15 ENCOUNTER — Non-Acute Institutional Stay (SKILLED_NURSING_FACILITY): Payer: Medicare Other | Admitting: Adult Health

## 2012-05-15 DIAGNOSIS — Z7901 Long term (current) use of anticoagulants: Secondary | ICD-10-CM

## 2012-05-15 DIAGNOSIS — M1712 Unilateral primary osteoarthritis, left knee: Secondary | ICD-10-CM

## 2012-05-15 DIAGNOSIS — M1710 Unilateral primary osteoarthritis, unspecified knee: Secondary | ICD-10-CM

## 2012-05-16 ENCOUNTER — Non-Acute Institutional Stay (SKILLED_NURSING_FACILITY): Payer: Medicare Other | Admitting: Adult Health

## 2012-05-16 DIAGNOSIS — E1349 Other specified diabetes mellitus with other diabetic neurological complication: Secondary | ICD-10-CM

## 2012-05-16 DIAGNOSIS — E119 Type 2 diabetes mellitus without complications: Secondary | ICD-10-CM

## 2012-05-16 DIAGNOSIS — E0840 Diabetes mellitus due to underlying condition with diabetic neuropathy, unspecified: Secondary | ICD-10-CM

## 2012-05-16 DIAGNOSIS — M1710 Unilateral primary osteoarthritis, unspecified knee: Secondary | ICD-10-CM

## 2012-05-16 DIAGNOSIS — E785 Hyperlipidemia, unspecified: Secondary | ICD-10-CM

## 2012-05-16 DIAGNOSIS — E1142 Type 2 diabetes mellitus with diabetic polyneuropathy: Secondary | ICD-10-CM

## 2012-05-16 DIAGNOSIS — M1712 Unilateral primary osteoarthritis, left knee: Secondary | ICD-10-CM

## 2012-05-16 DIAGNOSIS — I10 Essential (primary) hypertension: Secondary | ICD-10-CM

## 2012-05-16 DIAGNOSIS — D649 Anemia, unspecified: Secondary | ICD-10-CM

## 2012-05-16 DIAGNOSIS — E1169 Type 2 diabetes mellitus with other specified complication: Secondary | ICD-10-CM

## 2012-05-18 ENCOUNTER — Encounter: Payer: Self-pay | Admitting: Adult Health

## 2012-05-18 DIAGNOSIS — Z7901 Long term (current) use of anticoagulants: Secondary | ICD-10-CM | POA: Insufficient documentation

## 2012-05-18 NOTE — Progress Notes (Signed)
  Subjective:    Patient ID: Christine Silva, female    DOB: 1945/03/28, 67 y.o.   MRN: [id number]  HPI This is a 67 year old female who is for discharge home and will be followed-up by Home health PT, OT and Nursing. DME:  Rolling walker due to unsteady gait. She has been admitted to [facility] on 05/04/12 from [facility] with osteoarthritis S/P Left total knee arthroplasty. She has been admitted for a short-term rehabilitation.   Review of Systems  Constitutional: Negative.   HENT: Negative.   Eyes: Negative.   Respiratory: Negative for cough and shortness of breath.   Cardiovascular: Negative for leg swelling.  Gastrointestinal: Negative.   Endocrine: Negative.   Genitourinary: Negative.   Neurological: Negative.   Psychiatric/Behavioral: Negative.        Objective:   Physical Exam  Nursing note and vitals reviewed. Constitutional: She is oriented to person, place, and time. She appears well-developed and well-nourished.  HENT:  Head: Normocephalic and atraumatic.  Right Ear: External ear normal.  Left Ear: External ear normal.  Eyes: Conjunctivae are normal. Pupils are equal, round, and reactive to light.  Neck: Normal range of motion. Neck supple. No thyromegaly present.  Cardiovascular: Normal rate, regular rhythm and normal heart sounds.   Pulmonary/Chest: Effort normal and breath sounds normal. No respiratory distress.  Abdominal: Soft. Bowel sounds are normal.  Musculoskeletal: Normal range of motion. She exhibits no edema and no tenderness.  Neurological: She is alert and oriented to person, place, and time.  Skin: Skin is warm and dry.  Psychiatric: She has a normal mood and affect. Her behavior is normal. Judgment and thought content normal.    Medications reviewed per [facility]      Assessment & Plan:   Acute blood loss anemia  -  stable  Osteoarthritis of left knee S/P total knee arthroplasty - will have Home health PT, OT and Nursing  Diabetes  mellitus due to underlying condition with diabetic neuropathy  - well-controlled  Hypertension associated with diabetes - well-controlled  Anemia  -  stable Hyperlipidemia -  stable

## 2012-05-18 NOTE — Progress Notes (Signed)
Patient ID: Christine Silva, female   DOB: July 10, 1945, 67 y.o.   MRN: [id number] Subjective:     Indication: DVT prophylaxis Bleeding signs/symptoms: None Thromboembolic signs/symptoms: None  Missed Coumadin doses: None Medication changes: no Dietary changes: no Bacterial/viral infection: no Other concerns: no  The following portions of the patient's history were reviewed and updated as appropriate: allergies, current medications, past family history, past medical history, past social history, past surgical history and problem list.  Review of Systems A comprehensive review of systems was negative.   Objective:    INR Today: 1.7 Current dose:   6 mg  Assessment:    Subtherapeutic INR for goal of 2-3   Plan:    1. New dose: Increase Coumadin 6.5 mg PO Q D   2. Next INR:   05/18/12

## 2012-05-22 NOTE — Progress Notes (Signed)
Patient ID: Christine Silva, female   DOB: 11/03/1945, 67 y.o.   MRN: [id number]        HISTORY & PHYSICAL  DATE:  05/08/2012  FACILITY: [facility]   LEVEL OF CARE: SNF   ALLERGIES:  No Known Allergies  CHIEF COMPLAINT:  Manage left knee osteoarthritis, acute blood loss anemia and diabetes mellitus with neuropathy.    HISTORY OF PRESENT ILLNESS:  The patient is a 67 year-old, African-American female.    KNEE OSTEOARTHRITIS: Patient had a history of pain and functional disability in the knee due to end-stage osteoarthritis and has failed nonsurgical conservative treatments. Patient had worsening of pain with activity and weight bearing, pain that interfered with activities of daily living & pain with passive range of motion. Therefore patient underwent total knee arthroplasty and tolerated the procedure well. Patient is admitted to this facility for sort short-term rehabilitation. Patient denies knee pain.   ANEMIA: Postoperatively, the patient suffered acute blood loss anemia.  The anemia has been stable. The patient denies fatigue, melena or hematochezia. No complications from the medications currently being used.  Last hemoglobins are:  8.7, 7.7, 7.5, 7.  The patient did not require transfusion.    DM:pt's DM remains stable.  Pt denies polyuria, polydipsia, polyphagia, changes in vision or hypoglycemic episodes.  No complications noted from the medication presently being used.  Last hemoglobin A1c is: A recent  hemoglobin A1C is not available.   PAST MEDICAL HISTORY :  Past Medical History  Diagnosis Date  . Hyperlipidemia   . Hypertension   . Diabetes mellitus   . Anemia   . Other specified disorder of rectum and anus   . Pneumonia Feb 2014  . Arthritis   . Osteoarthritis of left knee 05/01/2012    PAST SURGICAL HISTORY: Past Surgical History  Procedure Laterality Date  . Cesarean section    . Colonoscopy    . Total knee arthroplasty Left 05/01/2012    Dr Dion Saucier  . Total  knee arthroplasty Left 05/01/2012    Procedure: TOTAL KNEE ARTHROPLASTY;  Surgeon: Eulas Post, MD;  Location: [facility] OR;  Service: Orthopedics;  Laterality: Left;    SOCIAL HISTORY:  reports that she has quit smoking. She has never used smokeless tobacco. She reports that she does not drink alcohol or use illicit drugs.  FAMILY HISTORY:  Family History  Problem Relation Age of Onset  . Diabetes Mellitus II Mother     CURRENT MEDICATIONS: Reviewed per [facility]  REVIEW OF SYSTEMS:  See HPI otherwise 14 point ROS is negative.  PHYSICAL EXAMINATION  VS:  T  99.4      P 96      RR 20      BP  104/58     POX 95% room air         WT (Lb)  GENERAL: no acute distress, normal body habitus SKIN: left lower extremity in an immobilizer, warm & dry, no suspicious lesions or rashes, no excessive dryness EYES: conjunctivae normal, sclerae normal, normal eye lids MOUTH/THROAT: lips without lesions,no lesions in the mouth,tongue is without lesions,uvula elevates in midline NECK: supple, trachea midline, no neck masses, no thyroid tenderness, no thyromegaly LYMPHATICS: no LAN in the neck, no supraclavicular LAN RESPIRATORY: breathing is even & unlabored, BS CTAB CARDIAC: RRR, no murmur,no extra heart sounds, no edema GI:  ABDOMEN: abdomen soft, normal BS, no masses, no tenderness  LIVER/SPLEEN: no hepatomegaly, no splenomegaly MUSCULOSKELETAL: HEAD: normal to inspection & palpation BACK:  no kyphosis, scoliosis or spinal processes tenderness EXTREMITIES: LEFT UPPER EXTREMITY: full range of motion, normal strength & tone RIGHT UPPER EXTREMITY:  full range of motion, normal strength & tone LEFT LOWER EXTREMITY:  strength intact, range of motion not tested due to surgery  RIGHT LOWER EXTREMITY:  full range of motion, normal strength & tone PSYCHIATRIC: the patient is alert & oriented to person, affect & behavior appropriate  LABS/RADIOLOGY: White count 9.4, platelets 373, hemoglobin 7.   INR 1.59.    Glucose 167, otherwise BMP normal.   Chest x-ray:  No acute disease.   Left knee x-ray postsurgically showed left knee arthroplasty.     ASSESSMENT/PLAN:  Left knee osteoarthritis.  Status post left total knee arthroplasty.     Diabetes mellitus with neuropathy.  Continue current medications.   Acute blood loss anemia.  Reassess hemoglobin level.  Continue iron.   Hypertension.  Well controlled.   Constipation.  Well controlled.    Check CBC and BMP.    I have reviewed patient's medical records received at admission/from hospitalization.  CPT CODE: 13086

## 2012-06-19 ENCOUNTER — Other Ambulatory Visit: Payer: Self-pay

## 2012-06-19 ENCOUNTER — Other Ambulatory Visit: Payer: Self-pay | Admitting: Family Medicine

## 2012-06-19 DIAGNOSIS — R921 Mammographic calcification found on diagnostic imaging of breast: Secondary | ICD-10-CM

## 2012-07-02 ENCOUNTER — Ambulatory Visit
Admission: RE | Admit: 2012-07-02 | Discharge: 2012-07-02 | Disposition: A | Discharge status: Home / Self Care | Payer: Medicare Other | Source: Ambulatory Visit | Attending: Family Medicine | Admitting: Family Medicine

## 2012-07-02 ENCOUNTER — Encounter: Payer: Self-pay | Admitting: Family Medicine

## 2012-07-02 ENCOUNTER — Ambulatory Visit (INDEPENDENT_AMBULATORY_CARE_PROVIDER_SITE_OTHER): Payer: Medicare Other | Admitting: Family Medicine

## 2012-07-02 DIAGNOSIS — Z96659 Presence of unspecified artificial knee joint: Secondary | ICD-10-CM

## 2012-07-02 DIAGNOSIS — Z96652 Presence of left artificial knee joint: Secondary | ICD-10-CM

## 2012-07-02 DIAGNOSIS — I10 Essential (primary) hypertension: Secondary | ICD-10-CM

## 2012-07-02 DIAGNOSIS — E1159 Type 2 diabetes mellitus with other circulatory complications: Secondary | ICD-10-CM

## 2012-07-02 DIAGNOSIS — D649 Anemia, unspecified: Secondary | ICD-10-CM

## 2012-07-02 DIAGNOSIS — E0840 Diabetes mellitus due to underlying condition with diabetic neuropathy, unspecified: Secondary | ICD-10-CM

## 2012-07-02 DIAGNOSIS — E1169 Type 2 diabetes mellitus with other specified complication: Secondary | ICD-10-CM

## 2012-07-02 DIAGNOSIS — E785 Hyperlipidemia, unspecified: Secondary | ICD-10-CM

## 2012-07-02 DIAGNOSIS — E1142 Type 2 diabetes mellitus with diabetic polyneuropathy: Secondary | ICD-10-CM

## 2012-07-02 DIAGNOSIS — R921 Mammographic calcification found on diagnostic imaging of breast: Secondary | ICD-10-CM

## 2012-07-02 DIAGNOSIS — E1349 Other specified diabetes mellitus with other diabetic neurological complication: Secondary | ICD-10-CM

## 2012-07-02 LAB — POCT GLYCOSYLATED HEMOGLOBIN (HGB A1C): Hemoglobin A1C: 5

## 2012-07-02 MED ORDER — INSULIN GLARGINE 100 UNIT/ML ~~LOC~~ SOLN: 10.0000 [IU] | Freq: Every morning | SUBCUTANEOUS | Status: DC

## 2012-07-02 MED ORDER — FERROUS SULFATE 325 (65 FE) MG PO TABS: 325.0000 mg | ORAL_TABLET | Freq: Every day | ORAL | Status: DC

## 2012-07-02 MED ORDER — METFORMIN HCL 500 MG PO TABS: 500.0000 mg | ORAL_TABLET | Freq: Two times a day (BID) | ORAL | Status: DC

## 2012-07-02 MED ORDER — LOSARTAN POTASSIUM-HCTZ 50-12.5 MG PO TABS: 1.0000 | ORAL_TABLET | Freq: Every day | ORAL | Status: DC

## 2012-07-02 MED ORDER — PRAVASTATIN SODIUM 40 MG PO TABS: 40.0000 mg | ORAL_TABLET | Freq: Every day | ORAL | Status: DC

## 2012-07-02 NOTE — Patient Instructions (Signed)
Keep working on that scar on your knee to get a nice and loose

## 2012-07-02 NOTE — Progress Notes (Signed)
  Subjective:    Christine Silva is a 67 y.o. female who presents for follow-up of Type 2 diabetes mellitus.   She is getting ready to go to [place] for several months and then might possibly move to [place] permanently. She has finished physical therapy rehabilitation for her knee however the scar is still giving her some trouble. Home blood sugar records: 217  Current symptoms/problems NONE. Daily foot checks, foot concerns: YES NO Last eye exam:     Medication compliance: Current diet: NONE Current exercise: GYM 3 TIMES A WEEK AND WALKS 7 DAYS A WEEK Known diabetic complications: none Cardiovascular risk factors: advanced age (older than 26 for men, 89 for women), diabetes mellitus, dyslipidemia and hypertension   The following portions of the patient's history were reviewed and updated as appropriate: allergies, current medications, past medical history, past social history, past surgical history and problem list.  ROS as in subjective above    Objective:     General appearence: alert, no distress, WD/WN Exam of her left knee does show the wound healing nicely. It has very poor mobilization. No effusion is noted.  Lab Review Lab Results  Component Value Date   HGBA1C 5.0 04/12/2012   Lab Results  Component Value Date   CHOL 162 08/23/2011   HDL 64 08/23/2011   LDLCALC 87 08/23/2011   TRIG 54 08/23/2011   CHOLHDL 2.5 08/23/2011   No results found for this basename: Concepcion Elk     Chemistry      Component Value Date/Time   NA 135 05/02/2012 0610   K 3.9 05/02/2012 0610   CL 100 05/02/2012 0610   CO2 29 05/02/2012 0610   BUN 12 05/02/2012 0610   CREATININE 0.72 05/02/2012 0610   CREATININE 0.67 08/23/2011 1114      Component Value Date/Time   CALCIUM 9.0 05/02/2012 0610   ALKPHOS 62 03/07/2012 0510   AST 24 03/07/2012 0510   ALT 16 03/07/2012 0510   BILITOT 2.0* 03/07/2012 0510        Chemistry      Component Value Date/Time   NA 135 05/02/2012 0610   K 3.9  05/02/2012 0610   CL 100 05/02/2012 0610   CO2 29 05/02/2012 0610   BUN 12 05/02/2012 0610   CREATININE 0.72 05/02/2012 0610   CREATININE 0.67 08/23/2011 1114      Component Value Date/Time   CALCIUM 9.0 05/02/2012 0610   ALKPHOS 62 03/07/2012 0510   AST 24 03/07/2012 0510   ALT 16 03/07/2012 0510   BILITOT 2.0* 03/07/2012 0510       Last optometry/ophthalmology exam reviewed from:    Assessment:  Diabetes mellitus due to underlying condition with diabetic neuropathy - Plan: POCT glycosylated hemoglobin (Hb A1C), metFORMIN (GLUCOPHAGE) 500 MG tablet  Hypertension associated with diabetes - Plan: losartan-hydrochlorothiazide (HYZAAR) 50-12.5 MG per tablet  Hyperlipidemia LDL goal <70 - Plan: pravastatin (PRAVACHOL) 40 MG tablet  Anemia  S/P total knee arthroplasty, left        Plan:    1.  Rx changes: none 2.  Education: Reviewed 'ABCs' of diabetes management (respective goals in parentheses):  A1C (<7), blood pressure (<130/80), and cholesterol (LDL <100). 3.  Compliance at present is estimated to be good. Efforts to improve compliance (if necessary) will be directed at none. 4. Follow up: 4 months  Instructed her on scar revision to get more motion. Explained that she needs to work on flexion and extension.

## 2012-07-03 ENCOUNTER — Other Ambulatory Visit: Payer: Self-pay

## 2012-07-10 ENCOUNTER — Telehealth: Payer: Self-pay | Admitting: Family Medicine

## 2012-07-10 NOTE — Telephone Encounter (Signed)
COPIES MADE

## 2012-07-10 NOTE — Telephone Encounter (Signed)
MAILED

## 2012-10-10 ENCOUNTER — Encounter: Payer: Self-pay | Admitting: Internal Medicine

## 2012-10-22 ENCOUNTER — Telehealth: Payer: Self-pay | Admitting: Family Medicine

## 2012-10-22 NOTE — Telephone Encounter (Signed)
Christine Silva called and ask that I talk with her daughter Christine Silva as pt cannot clearly understand [place].  Pt uses Lantus and this is a 3rd tier medication and her insurance said we needed to ask for a Tier Exception.  I called [employer] and then was transferred to [facility] Rx [phone] X0 t/w Alinda Money.  He will put through a request to the pharmacist and we will have a review with in 48 - 72 hours.  I called Christine Silva, she said she would be in [place] by then and I would need to talk with Christine Silva the dtr n law [phone] or try Christine Silva [phone].

## 2012-11-01 ENCOUNTER — Telehealth: Payer: Self-pay | Admitting: Medical

## 2012-11-26 ENCOUNTER — Encounter: Payer: Self-pay | Admitting: Family Medicine

## 2012-11-26 ENCOUNTER — Ambulatory Visit (INDEPENDENT_AMBULATORY_CARE_PROVIDER_SITE_OTHER): Payer: Medicare Other | Admitting: Family Medicine

## 2012-11-26 VITALS — BP 110/70 | HR 60 | Wt 145.0 lb

## 2012-11-26 DIAGNOSIS — E1169 Type 2 diabetes mellitus with other specified complication: Secondary | ICD-10-CM

## 2012-11-26 DIAGNOSIS — Z79899 Other long term (current) drug therapy: Secondary | ICD-10-CM

## 2012-11-26 DIAGNOSIS — E119 Type 2 diabetes mellitus without complications: Secondary | ICD-10-CM

## 2012-11-26 DIAGNOSIS — G4700 Insomnia, unspecified: Secondary | ICD-10-CM

## 2012-11-26 DIAGNOSIS — E0840 Diabetes mellitus due to underlying condition with diabetic neuropathy, unspecified: Secondary | ICD-10-CM

## 2012-11-26 DIAGNOSIS — I10 Essential (primary) hypertension: Secondary | ICD-10-CM

## 2012-11-26 DIAGNOSIS — E1349 Other specified diabetes mellitus with other diabetic neurological complication: Secondary | ICD-10-CM

## 2012-11-26 DIAGNOSIS — E785 Hyperlipidemia, unspecified: Secondary | ICD-10-CM

## 2012-11-26 DIAGNOSIS — E1159 Type 2 diabetes mellitus with other circulatory complications: Secondary | ICD-10-CM

## 2012-11-26 DIAGNOSIS — E1142 Type 2 diabetes mellitus with diabetic polyneuropathy: Secondary | ICD-10-CM

## 2012-11-26 LAB — COMPREHENSIVE METABOLIC PANEL
AST: 19 U/L (ref 0–37)
Albumin: 4.2 g/dL (ref 3.5–5.2)
Alkaline Phosphatase: 71 U/L (ref 39–117)
BUN: 16 mg/dL (ref 6–23)
Calcium: 9.6 mg/dL (ref 8.4–10.5)
Chloride: 101 mEq/L (ref 96–112)
Glucose, Bld: 155 mg/dL — ABNORMAL HIGH (ref 70–99)
Potassium: 4.3 mEq/L (ref 3.5–5.3)
Sodium: 136 mEq/L (ref 135–145)
Total Protein: 6.4 g/dL (ref 6.0–8.3)

## 2012-11-26 LAB — LIPID PANEL
LDL Cholesterol: 80 mg/dL (ref 0–99)
Triglycerides: 95 mg/dL (ref <150)

## 2012-11-26 MED ORDER — TRAZODONE HCL 50 MG PO TABS: 50.0000 mg | ORAL_TABLET | Freq: Every evening | ORAL | Status: DC | PRN

## 2012-11-26 NOTE — Progress Notes (Signed)
  Subjective:    Christine Silva is a 67 y.o. female who presents for follow-up of Type 2 diabetes mellitus.  Her main complaint today is continued difficulty with left knee pain and also some difficulty with right knee pain. She also complains of a several month history of difficulty with sleep. She does not take naps, does not drink a lot of caffeine. She has been in [place] for several months but is now back here and does not plan to leave.  Home blood sugar records: 120 150  Current symptoms/problems include NONE Daily foot checks:  Any foot concerns: NONE Last eye exam:  July 2014   Medication compliance:good Current diet: NONE Current exercise: GYM,WALKING Known diabetic complications: none Cardiovascular risk factors: advanced age (older than 4 for men, 12 for women), diabetes mellitus, dyslipidemia and hypertension   The following portions of the patient's history were reviewed and updated as appropriate: allergies, current medications, past family history, past medical history, past social history, past surgical history and problem list.  ROS as in subjective above    Objective:      General appearence: alert, no distress, WD/WN Ext: no edema Foot exam:  Neuro: foot monofilament exam normal   Lab Review Lab Results  Component Value Date   HGBA1C 5.0 07/02/2012   Lab Results  Component Value Date   CHOL 162 08/23/2011   HDL 64 08/23/2011   LDLCALC 87 08/23/2011   TRIG 54 08/23/2011   CHOLHDL 2.5 08/23/2011   No results found for this basenameConcepcion Elk     Chemistry      Component Value Date/Time   NA 135 05/02/2012 0610   K 3.9 05/02/2012 0610   CL 100 05/02/2012 0610   CO2 29 05/02/2012 0610   BUN 12 05/02/2012 0610   CREATININE 0.72 05/02/2012 0610   CREATININE 0.67 08/23/2011 1114      Component Value Date/Time   CALCIUM 9.0 05/02/2012 0610   ALKPHOS 62 03/07/2012 0510   AST 24 03/07/2012 0510   ALT 16 03/07/2012 0510   BILITOT 2.0* 03/07/2012 0510         Chemistry      Component Value Date/Time   NA 135 05/02/2012 0610   K 3.9 05/02/2012 0610   CL 100 05/02/2012 0610   CO2 29 05/02/2012 0610   BUN 12 05/02/2012 0610   CREATININE 0.72 05/02/2012 0610   CREATININE 0.67 08/23/2011 1114      Component Value Date/Time   CALCIUM 9.0 05/02/2012 0610   ALKPHOS 62 03/07/2012 0510   AST 24 03/07/2012 0510   ALT 16 03/07/2012 0510   BILITOT 2.0* 03/07/2012 0510          Assessment:   Encounter Diagnoses  Name Primary?  . Diabetes mellitus Yes  . Insomnia   . Encounter for long-term (current) use of other medications   . Diabetes mellitus due to underlying condition with diabetic neuropathy   . Hypertension associated with diabetes   . Hyperlipidemia LDL goal <70          Plan:    1.  Rx changes: none 2.  Education: Reviewed 'ABCs' of diabetes management (respective goals in parentheses):  A1C (<7), blood pressure (<130/80), and cholesterol (LDL <100). 3.  Compliance at present is estimated to be good. Efforts to improve compliance (if necessary) will be directed at none. 4. Follow up: 4 months

## 2012-11-26 NOTE — Patient Instructions (Addendum)
See Dr. Dion Saucier for followup on your knees Take the sleep medication for 3 or 4 days to help you sleep and then use the rest of them as needed

## 2012-11-27 LAB — CBC WITH DIFFERENTIAL/PLATELET
Basophils Relative: 1 % (ref 0–1)
HCT: 25.8 % — ABNORMAL LOW (ref 36.0–46.0)
Hemoglobin: 8.9 g/dL — ABNORMAL LOW (ref 12.0–15.0)
Lymphocytes Relative: 37 % (ref 12–46)
MCHC: 34.5 g/dL (ref 30.0–36.0)
Monocytes Absolute: 0.6 10*3/uL (ref 0.1–1.0)
Monocytes Relative: 10 % (ref 3–12)
Neutro Abs: 2.6 10*3/uL (ref 1.7–7.7)
Neutrophils Relative %: 45 % (ref 43–77)
RBC: 2.79 MIL/uL — ABNORMAL LOW (ref 3.87–5.11)
WBC: 5.8 10*3/uL (ref 4.0–10.5)

## 2012-11-27 LAB — HEMOGLOBIN A1C
Hgb A1c MFr Bld: 4.9 % (ref <5.7)
Mean Plasma Glucose: 94 mg/dL (ref <117)

## 2012-11-27 NOTE — Progress Notes (Signed)
Quick Note:  MAILED LETTER OF RESULTS ______ 

## 2012-11-27 NOTE — Telephone Encounter (Signed)
LM

## 2012-12-27 ENCOUNTER — Ambulatory Visit (INDEPENDENT_AMBULATORY_CARE_PROVIDER_SITE_OTHER): Payer: Medicare Other | Admitting: Family Medicine

## 2012-12-27 ENCOUNTER — Encounter: Payer: Self-pay | Admitting: Family Medicine

## 2012-12-27 VITALS — BP 116/60 | HR 84 | Wt 151.0 lb

## 2012-12-27 DIAGNOSIS — E119 Type 2 diabetes mellitus without complications: Secondary | ICD-10-CM

## 2012-12-27 DIAGNOSIS — G4700 Insomnia, unspecified: Secondary | ICD-10-CM

## 2012-12-27 MED ORDER — TRAZODONE HCL 50 MG PO TABS: 50.0000 mg | ORAL_TABLET | Freq: Every evening | ORAL | Status: DC | PRN

## 2012-12-27 NOTE — Progress Notes (Signed)
   Subjective:    Patient ID: Christine Silva, female    DOB: July 17, 1945, 67 y.o.   MRN: [id number]  HPI She is here for a recheck. She has been using her trazodone intermittently however in between times she will use a over-the-counter sleep aid. She reports that she is essentially using one or the other every day. Review of her record indicates her last hemoglobin A1c was 4.9. She states that she uses her Lantus regularly and sometimes increases it based on her morning blood sugars.   Review of Systems     Objective:   Physical Exam Alert and in no distress otherwise not examined       Assessment & Plan:  Insomnia - Plan: traZODone (DESYREL) 50 MG tablet  Diabetes mellitus  I discussed at length her sleep issues. I will give her does real to use on a daily basis rather than have her use what is probably Benadryl. Also recommended stopping the insulin but continuing on metformin. Discussed checking her blood sugar before a meal and 2 hours after a meal. Explained that there is a risk of having her on insulin and having her blood sugar dropped too low. She will call if she has difficulties with her blood sugars otherwise I will see her in 3 months

## 2012-12-27 NOTE — Patient Instructions (Signed)
Stop the insulin. Check your blood sugars either before a meal or 2 hours after a meal and recognizes that 2 hours after a meal it will be elevated. Stay on the metformin.

## 2012-12-28 ENCOUNTER — Telehealth: Payer: Self-pay | Admitting: Internal Medicine

## 2012-12-28 NOTE — Telephone Encounter (Signed)
Pt states you told her not to take her lantus anymore and her blood sugar was 168 last night and then 180 this morning. What do you want the patient to do?

## 2012-12-28 NOTE — Telephone Encounter (Signed)
Have her monitor her blood sugars through the weekend and if they continue to stay up then I will place her back on her Lantus.

## 2012-12-28 NOTE — Telephone Encounter (Signed)
PT CALLED AND INFORMED WORD FOR WORD SHE WILL CALL BACK Monday TO LET us KNOW READINGS

## 2012-12-31 ENCOUNTER — Telehealth: Payer: Self-pay | Admitting: Internal Medicine

## 2012-12-31 NOTE — Telephone Encounter (Signed)
Left message word for word Have her start back on her insulin at 10 units keep track of her blood sugars. Have her call me towards the end of the week

## 2012-12-31 NOTE — Telephone Encounter (Signed)
Pt states that Friday her blood sugar was 180 am and 290 pm, Saturday 187 am, 197 pm, Sunday 190 am, 208 pm, and this morning was 291. Pt uses [facility]

## 2012-12-31 NOTE — Telephone Encounter (Signed)
Have her start back on her insulin at 10 units keep track of her blood sugars. Have her call me towards the end of the week

## 2013-02-19 ENCOUNTER — Other Ambulatory Visit: Payer: Self-pay | Admitting: Family Medicine

## 2013-03-21 LAB — HM DIABETES EYE EXAM

## 2013-03-28 ENCOUNTER — Encounter: Payer: Self-pay | Admitting: Internal Medicine

## 2013-04-02 ENCOUNTER — Encounter: Payer: Self-pay | Admitting: Family Medicine

## 2013-04-02 ENCOUNTER — Ambulatory Visit (INDEPENDENT_AMBULATORY_CARE_PROVIDER_SITE_OTHER): Payer: Medicare Other | Admitting: Family Medicine

## 2013-04-02 VITALS — BP 100/50 | HR 64 | Ht 63.0 in | Wt 156.0 lb

## 2013-04-02 DIAGNOSIS — R82998 Other abnormal findings in urine: Secondary | ICD-10-CM

## 2013-04-02 DIAGNOSIS — E1169 Type 2 diabetes mellitus with other specified complication: Secondary | ICD-10-CM

## 2013-04-02 DIAGNOSIS — N318 Other neuromuscular dysfunction of bladder: Secondary | ICD-10-CM

## 2013-04-02 DIAGNOSIS — D649 Anemia, unspecified: Secondary | ICD-10-CM

## 2013-04-02 DIAGNOSIS — Z0000 Encounter for general adult medical examination without abnormal findings: Secondary | ICD-10-CM

## 2013-04-02 DIAGNOSIS — M1990 Unspecified osteoarthritis, unspecified site: Secondary | ICD-10-CM

## 2013-04-02 DIAGNOSIS — E119 Type 2 diabetes mellitus without complications: Secondary | ICD-10-CM

## 2013-04-02 DIAGNOSIS — N3281 Overactive bladder: Secondary | ICD-10-CM

## 2013-04-02 DIAGNOSIS — R8290 Unspecified abnormal findings in urine: Secondary | ICD-10-CM

## 2013-04-02 DIAGNOSIS — M129 Arthropathy, unspecified: Secondary | ICD-10-CM

## 2013-04-02 DIAGNOSIS — I152 Hypertension secondary to endocrine disorders: Secondary | ICD-10-CM

## 2013-04-02 DIAGNOSIS — I10 Essential (primary) hypertension: Secondary | ICD-10-CM

## 2013-04-02 DIAGNOSIS — E1159 Type 2 diabetes mellitus with other circulatory complications: Secondary | ICD-10-CM

## 2013-04-02 DIAGNOSIS — E785 Hyperlipidemia, unspecified: Secondary | ICD-10-CM

## 2013-04-02 LAB — POCT URINALYSIS DIPSTICK
Bilirubin, UA: NEGATIVE
Glucose, UA: NEGATIVE
Ketones, UA: NEGATIVE
Nitrite, UA: POSITIVE
Protein, UA: NEGATIVE
RBC UA: NEGATIVE
SPEC GRAV UA: 1.01
UROBILINOGEN UA: NEGATIVE
pH, UA: 5

## 2013-04-02 LAB — POCT GLYCOSYLATED HEMOGLOBIN (HGB A1C): Hemoglobin A1C: 5.4

## 2013-04-02 MED ORDER — OXYBUTYNIN CHLORIDE 5 MG PO TABS: 5.0000 mg | ORAL_TABLET | Freq: Three times a day (TID) | ORAL | Status: DC

## 2013-04-02 NOTE — Addendum Note (Signed)
Addended by: Denita Lung on: 04/02/2013 01:26 PM   Modules accepted: Orders

## 2013-04-02 NOTE — Progress Notes (Signed)
Subjective:    Patient ID: Christine Silva, female    DOB: August 01, 1945, 68 y.o.   MRN: [id number]  HPI She is here for a complete examination. She does have underlying diabetes. Her A1c as well as been quite good. Recently I tried to stop the insulin however her blood sugars went into the high 200s and she is now back on insulin. She has an underlying history of anemia and continues on iron supplementation for this. She has had a left knee replacement and seems to be doing well with this. She continues on her blood pressure medication. She is now back living in [place]. She did for short period time move to [place]. She continues to complain of insomnia and after a considerable amount of time it was made clear that she wakes up every hour or so to urinate. Further questioning indicates she also urinates 5 or 6 times during the day. Her normal sleep pattern is 6-1/2 hours. She retired approximately one year ago. Social and family history were reviewed.   Review of Systems  All other systems reviewed and are negative.       Objective:   Physical Exam BP 100/50  Pulse 64  Ht 5\' 3"  (1.6 m)  Wt 156 lb (70.761 kg)  BMI 27.64 kg/m2  General Appearance:    Alert, cooperative, no distress, appears stated age  Head:    Normocephalic, without obvious abnormality, atraumatic  Eyes:    PERRL, conjunctiva/corneas clear, EOM's intact, fundi    benign  Ears:    Normal TM's and external ear canals  Nose:   Nares normal, mucosa normal, no drainage or sinus   tenderness  Throat:   Lips, mucosa, and tongue normal; teeth and gums normal  Neck:   Supple, no lymphadenopathy;  thyroid:  no   enlargement/tenderness/nodules; no carotid   bruit or JVD  Back:    Spine nontender, no curvature, ROM normal, no CVA     tenderness  Lungs:     Clear to auscultation bilaterally without wheezes, rales or     ronchi; respirations unlabored  Chest Wall:    No tenderness or deformity   Heart:    Regular rate and  rhythm, S1 and S2 normal, no murmur, rub   or gallop  Breast Exam:    Deferred to GYN  Abdomen:     Soft, non-tender, nondistended, normoactive bowel sounds,    no masses, no hepatosplenomegaly  Genitalia:    Deferred to GYN     Extremities:   No clubbing, cyanosis or edema  Pulses:   2+ and symmetric all extremities  Skin:   Skin color, texture, turgor normal, no rashes or lesions  Lymph nodes:   Cervical, supraclavicular, and axillary nodes normal  Neurologic:   CNII-XII intact, normal strength, sensation and gait; reflexes 2+ and symmetric throughout          Psych:   Normal mood, affect, hygiene and grooming.          Assessment & Plan:  Diabetes mellitus - Plan: HgB A1c  Routine general medical examination at a health care facility - Plan: Urinalysis Dipstick  Anemia  Arthritis  Hyperlipidemia LDL goal <70  Hypertension associated with diabetes  OAB (overactive bladder) - Plan: oxybutynin (DITROPAN) 5 MG tablet  it was difficult to get a good history out of her concerning her sleep but it does seem to be bladder related. I will start her on Ditropan. Discussed possible side effects  with her. She is to continue on her other medications including insulin.

## 2013-04-02 NOTE — Patient Instructions (Signed)
Take the new medication daily and if you have any difficulty with it let me know

## 2013-04-02 NOTE — Progress Notes (Signed)
   Subjective:    Patient ID: Christine Silva, female    DOB: October 08, 1945, 68 y.o.   MRN: [id number]  HPI    Review of Systems     Objective:   Physical Exam        Assessment & Plan:  Her urine dipstick was positive for nitrites. I will culture her urine especially since she is having some urinary symptoms.

## 2013-04-04 ENCOUNTER — Telehealth: Payer: Self-pay | Admitting: Internal Medicine

## 2013-04-04 NOTE — Telephone Encounter (Signed)
Pt states the med that you put her on is not working right, she is sleepy, right breast hurts, dizzy. If you send a new med to pharmacy send to [facility]

## 2013-04-04 NOTE — Telephone Encounter (Signed)
Pt notifed.

## 2013-04-04 NOTE — Telephone Encounter (Signed)
Have her stop the Ditropan and start on the antibiotic. Tell her that I would like to see how she does on the antibiotic before using anything else. Schedule her to return here in 2 weeks

## 2013-04-05 LAB — CULTURE, URINE COMPREHENSIVE: Colony Count: >=100000

## 2013-04-12 ENCOUNTER — Telehealth: Payer: Self-pay | Admitting: Family Medicine

## 2013-04-12 NOTE — Telephone Encounter (Signed)
Pt.notified

## 2013-04-12 NOTE — Telephone Encounter (Signed)
That medication shouldn't really cause her to bleed but go ahead and have her stop that and  followup with me Monday

## 2013-04-12 NOTE — Telephone Encounter (Signed)
Pt was called to remind her of her appt on Monday. Pt states that the medication she was put on is making her bleed. She states its not a lot but it is very red. Please advise pt of what to do. Pt does have an appt on Monday.

## 2013-04-15 ENCOUNTER — Encounter: Payer: Self-pay | Admitting: Family Medicine

## 2013-04-15 ENCOUNTER — Ambulatory Visit (INDEPENDENT_AMBULATORY_CARE_PROVIDER_SITE_OTHER): Payer: Medicare Other | Admitting: Family Medicine

## 2013-04-15 VITALS — BP 112/54 | HR 60 | Wt 160.0 lb

## 2013-04-15 DIAGNOSIS — R3589 Other polyuria: Secondary | ICD-10-CM

## 2013-04-15 DIAGNOSIS — R358 Other polyuria: Secondary | ICD-10-CM

## 2013-04-15 DIAGNOSIS — E119 Type 2 diabetes mellitus without complications: Secondary | ICD-10-CM

## 2013-04-15 LAB — POCT URINALYSIS DIPSTICK
BILIRUBIN UA: NEGATIVE
GLUCOSE UA: 1000
Ketones, UA: NEGATIVE
Nitrite, UA: NEGATIVE
Protein, UA: NEGATIVE
SPEC GRAV UA: 1.015
Urobilinogen, UA: NEGATIVE
pH, UA: 5

## 2013-04-15 LAB — GLUCOSE, POCT (MANUAL RESULT ENTRY): POC GLUCOSE: 329 mg/dL — AB (ref 70–99)

## 2013-04-15 LAB — HEMOGLOBIN A1C
Hgb A1c MFr Bld: 5.2 % (ref <5.7)
Mean Plasma Glucose: 103 mg/dL (ref <117)

## 2013-04-15 NOTE — Progress Notes (Signed)
   Subjective:    Patient ID: Christine Silva, female    DOB: October 07, 1945, 68 y.o.   MRN: [id number]  HPI She is here for a recheck. She states that the Detrol  caused difficulty with headache, itching and difficulty with breathing. Her urine did show evidence of being nitrite positive. She was placed on Septra but states it did cause some slight bleeding . She was however able to take it for a week. She took her blood sugar this morning and states it was 135.  Review of Systems     Objective:   Physical Exam  alert and in no distress. Urine dipstick is recorded. Microscopic showed no blood, white cells or bacteria. Blood sugar was 329       Assessment & Plan:  Type II or unspecified type diabetes mellitus without mention of complication, not stated as uncontrolled - Plan: Urinalysis Dipstick, Glucose (CBG), Hemoglobin A1c  Polyuria  there is a question of where the polyuria is coming from. Could be related to uncontrolled diabetes and I will verify that with another A1c through the labrum and fingerstick. If her A1c is in the good range we will still need to monitor this due to the 329 reading. Might also need at a different OAB medicine.

## 2013-04-16 MED ORDER — SOLIFENACIN SUCCINATE 5 MG PO TABS: 5.0000 mg | ORAL_TABLET | Freq: Every day | ORAL | Status: DC

## 2013-04-16 NOTE — Addendum Note (Signed)
Addended by: Denita Lung on: 04/16/2013 01:28 PM   Modules accepted: Orders

## 2013-04-18 ENCOUNTER — Telehealth: Payer: Self-pay

## 2013-04-18 NOTE — Telephone Encounter (Signed)
Work with her and have her check with her insurance plan to find out which one out of this class is covered.

## 2013-04-18 NOTE — Telephone Encounter (Signed)
Pt called and states that she went to pick up her Vesicare and her copay for the medicine was $220 and she states she cant afford that and could you please Rx her something cheaper. Please advise

## 2013-04-22 NOTE — Telephone Encounter (Signed)
Pt notified of message and she states she will give [place] a call once she has contacted her insurance company

## 2013-06-08 ENCOUNTER — Other Ambulatory Visit: Payer: Self-pay | Admitting: Family Medicine

## 2013-06-17 ENCOUNTER — Other Ambulatory Visit: Payer: Self-pay | Admitting: Family Medicine

## 2013-06-17 DIAGNOSIS — R921 Mammographic calcification found on diagnostic imaging of breast: Secondary | ICD-10-CM

## 2013-06-19 ENCOUNTER — Other Ambulatory Visit: Payer: Self-pay | Admitting: Family Medicine

## 2013-07-03 ENCOUNTER — Encounter (INDEPENDENT_AMBULATORY_CARE_PROVIDER_SITE_OTHER): Payer: Self-pay

## 2013-07-03 ENCOUNTER — Ambulatory Visit
Admission: RE | Admit: 2013-07-03 | Discharge: 2013-07-03 | Disposition: A | Discharge status: Home / Self Care | Payer: Medicare Other | Source: Ambulatory Visit | Attending: Family Medicine | Admitting: Family Medicine

## 2013-07-03 DIAGNOSIS — R921 Mammographic calcification found on diagnostic imaging of breast: Secondary | ICD-10-CM

## 2013-07-04 IMAGING — NM NM HEPATOBILIARY IMAGE, INC GB
2 series · 12 of 12 positions shown · non-contrast
Comparison: 03/02/2012 ultrasound

CLINICAL DATA: 67-year-old female with right upper quadrant
abdominal pain and cholelithiasis identified on recent ultrasound.

NUCLEAR MEDICINE HEPATOBILIARY IMAGING
TECHNIQUE: Sequential images of the abdomen were obtained [DATE] minutes following intravenous administration of
radiopharmaceutical.
Radiopharmaceutical:  [id number] [id number]-EEm Choletec
2.8 mg of morphine was administered intravenously during the
procedure as well.

[Series 1: gb hepatobiliary scan · 4.75mm/px · 6 of 30 frames shown (1 of 2)]
[frame 3/30]
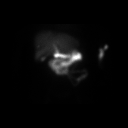
[frame 8/30]
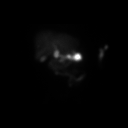
[frame 13/30]
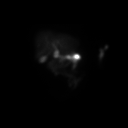
[frame 18/30]
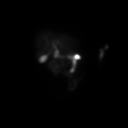
[frame 23/30]
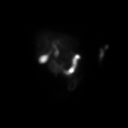
[frame 28/30]
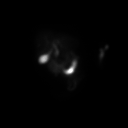

[Series 1: gb hepatobiliary scan · 4.75mm/px · 6 of 60 frames shown (2 of 2)]
[frame 6/60]
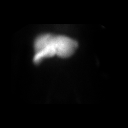
[frame 16/60]
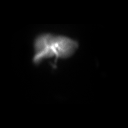
[frame 26/60]
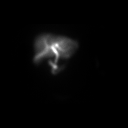
[frame 36/60]
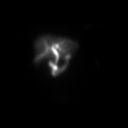
[frame 46/60]
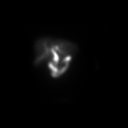
[frame 56/60]
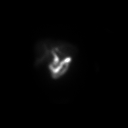

[12 of 12 positions shown; findings below may reference images not displayed]

FINDINGS: Prompt homogeneous hepatic activity is identified.  There
is slight increased activity in the liver adjacent to the
gallbladder fossa.
Small bowel activity is identified at 15 minutes.

As gallbladder activity was not identified at 60 minutes, 2.8 mg of
IV morphine was administered without complication.

Gallbladder activity is identified at 5-10 minutes following
morphine administration.
IMPRESSION: No evidence of cystic duct obstruction/acute cholecystitis.
However, delay of gallbladder filling and possible slight hyperemia
in the liver adjacent to the gallbladder suggests some degree of
biliary dysfunction and possibly chronic cholecystitis.

## 2013-07-07 IMAGING — CR DG CHEST 2V
2 series · 2 of 2 positions shown · non-contrast
Comparison: 03/03/2012

CLINICAL DATA: Evaluate effusion, weakness, shortness of breath.

CHEST - 2 VIEW

[w chest pa]
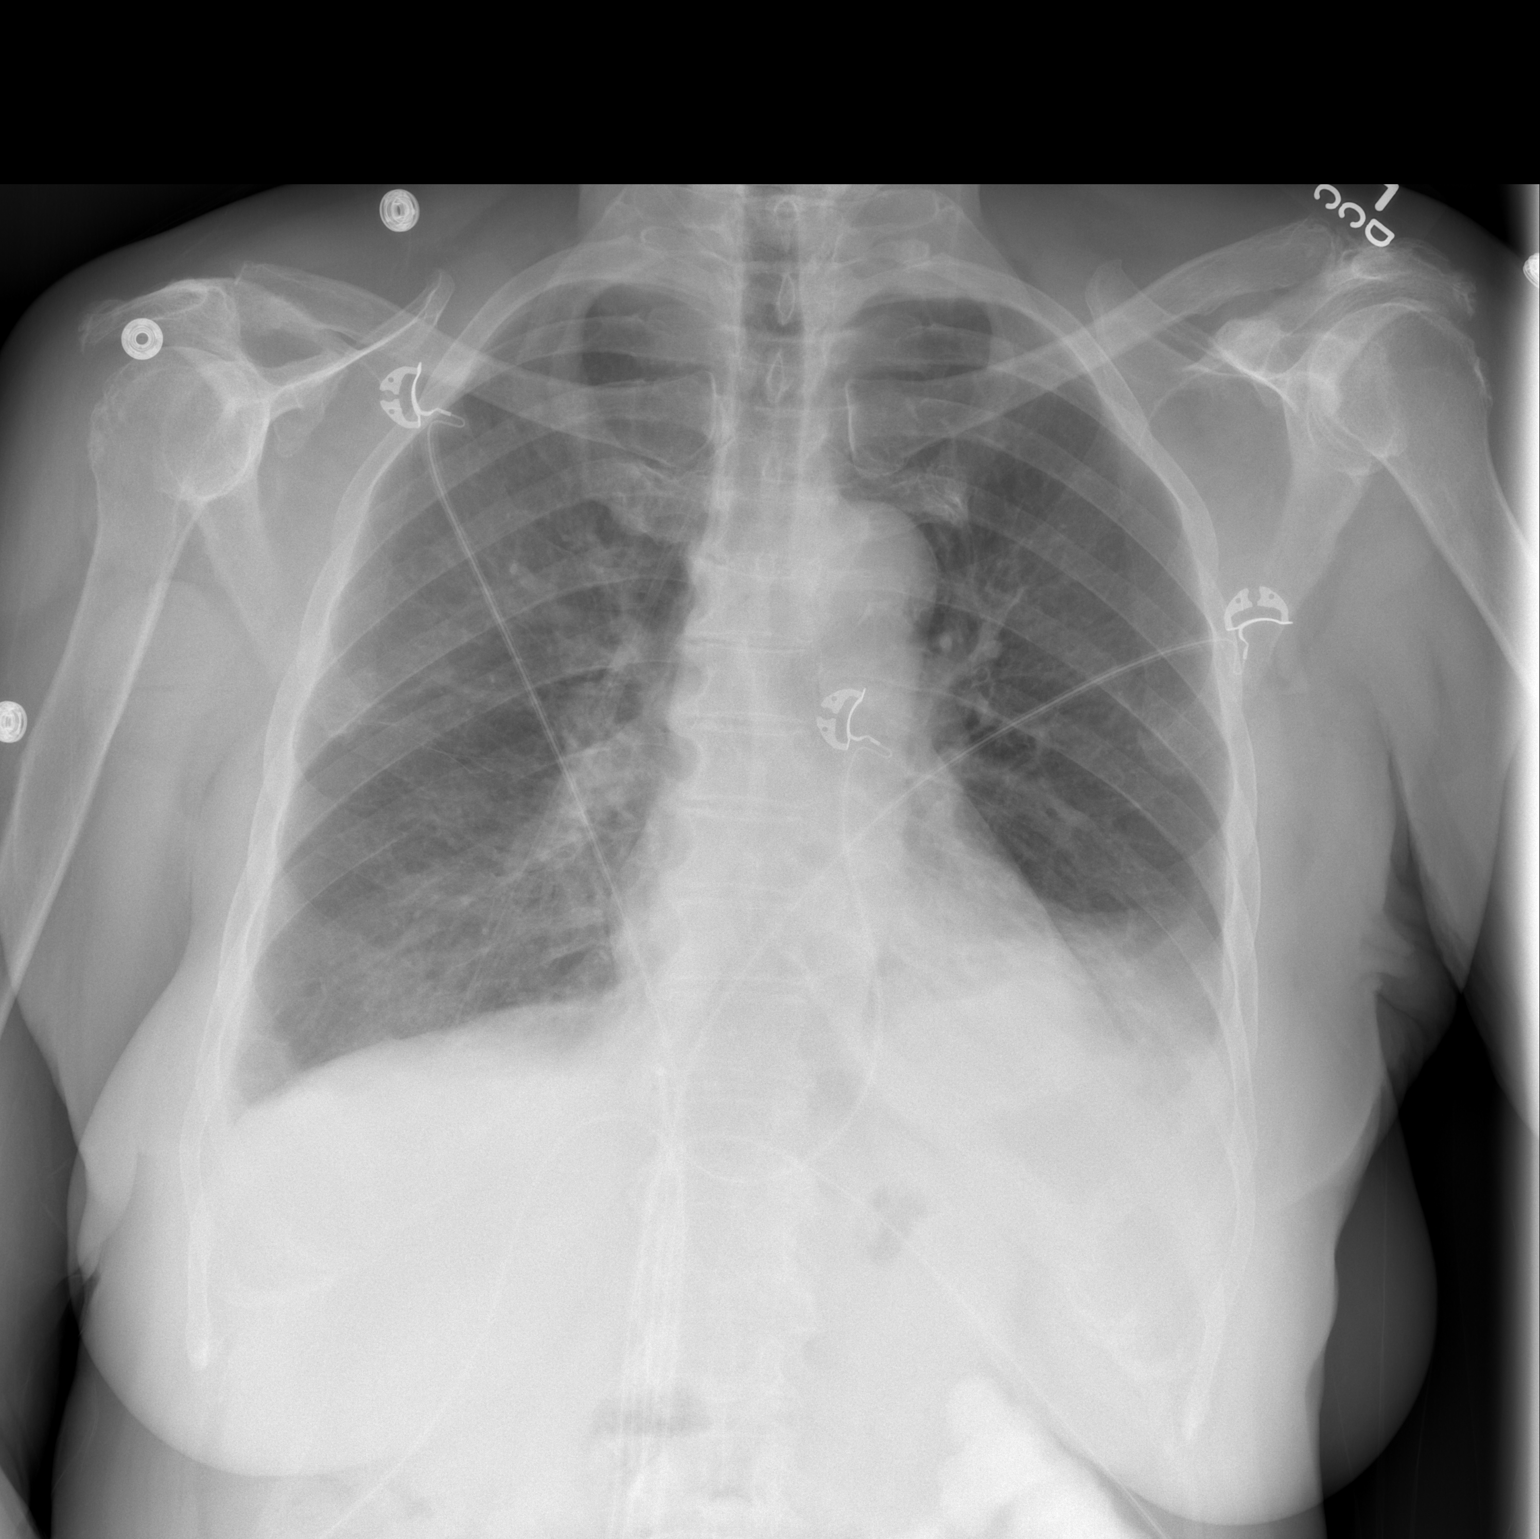

[w chest lat]
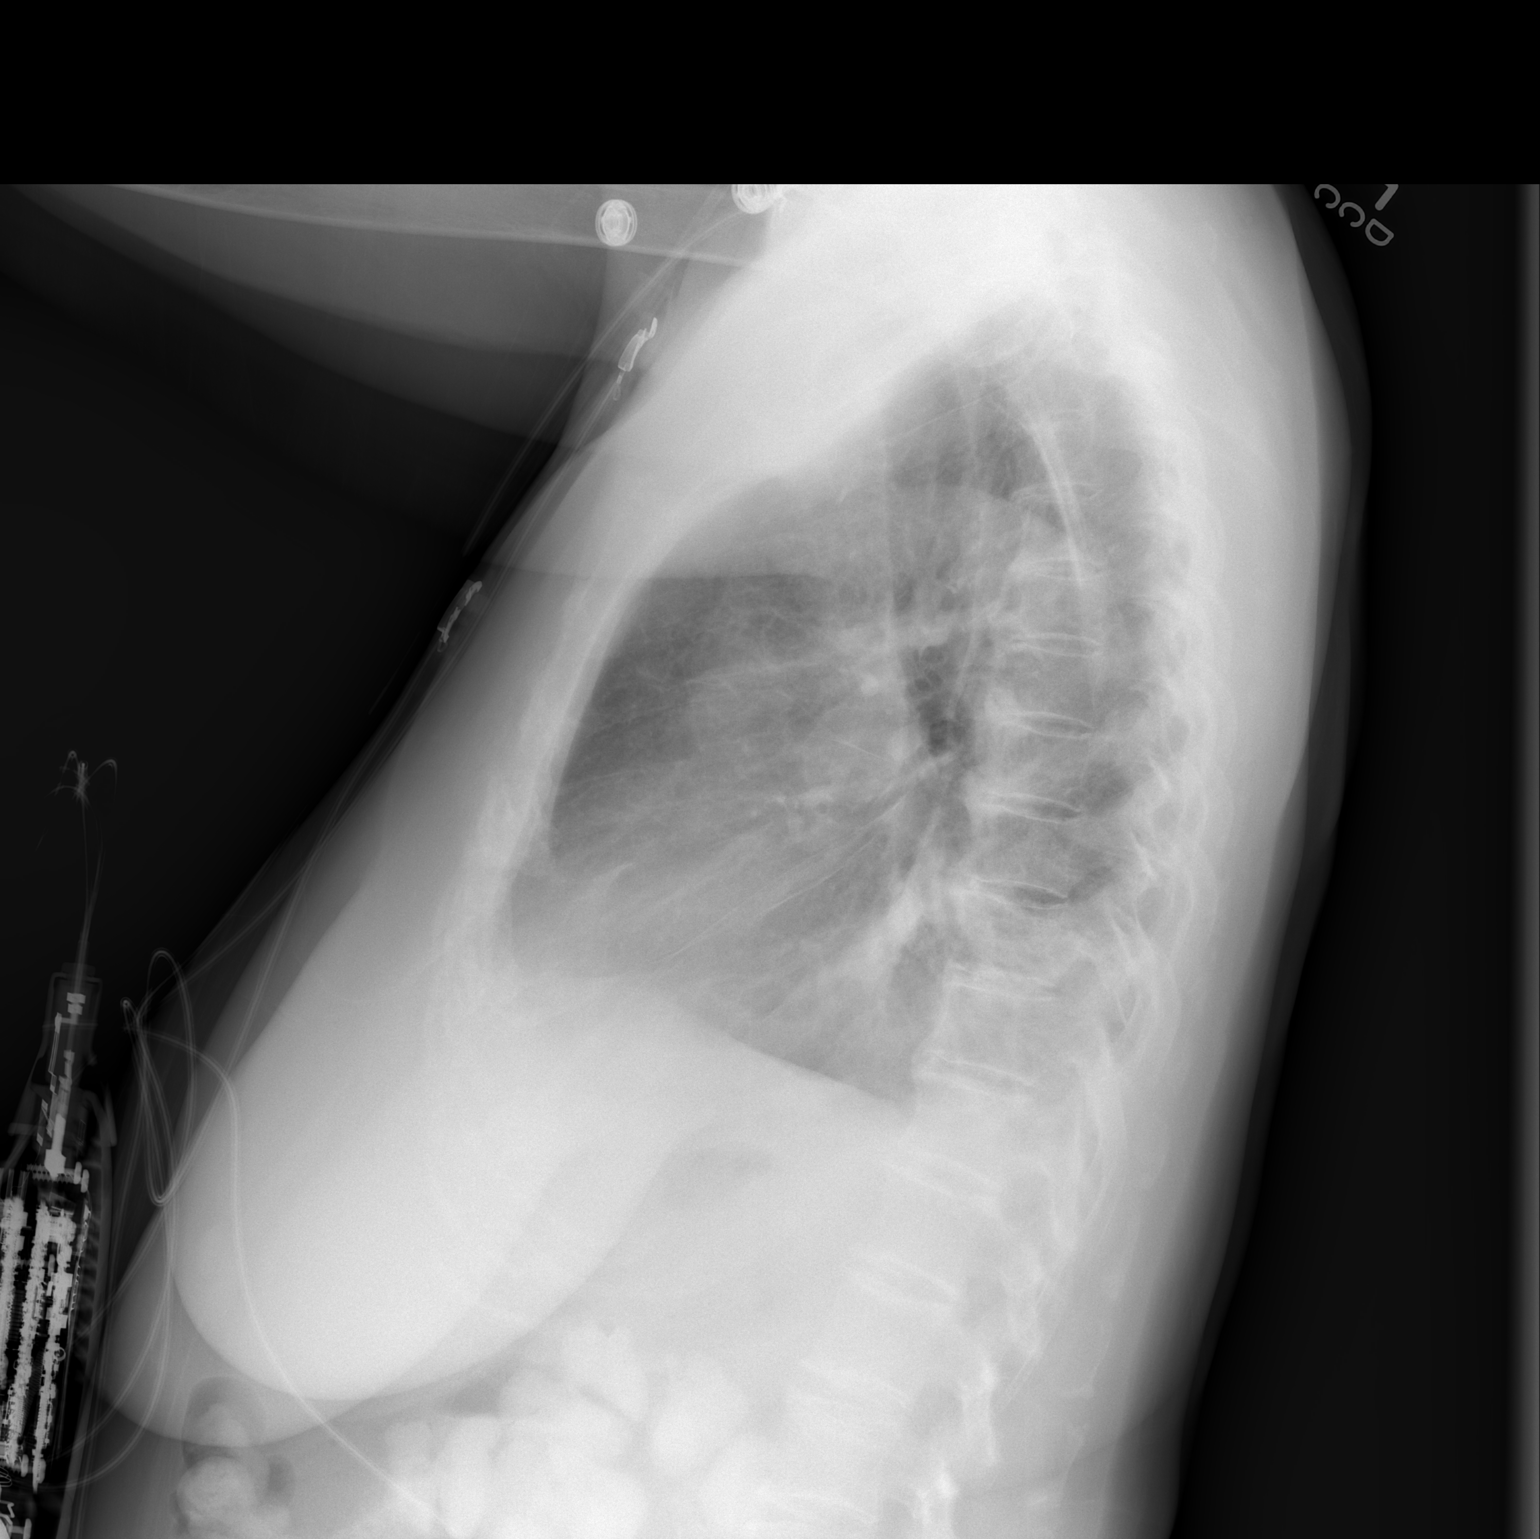

[2 of 2 positions shown; findings below may reference images not displayed]

FINDINGS: There is a small left pleural effusion, slightly
decreased since prior study.  Improving left base atelectasis or
infiltrate.  Suspect trace right effusion.  No focal opacity on the
right.  Heart is normal size.
IMPRESSION: Improving left effusion and left lower lobe atelectasis or
infiltrate.

Trace right effusion.

## 2013-07-08 ENCOUNTER — Ambulatory Visit (INDEPENDENT_AMBULATORY_CARE_PROVIDER_SITE_OTHER): Payer: Medicare Other | Admitting: Family Medicine

## 2013-07-08 ENCOUNTER — Encounter: Payer: Self-pay | Admitting: Family Medicine

## 2013-07-08 VITALS — BP 110/60 | HR 80 | Wt 154.0 lb

## 2013-07-08 DIAGNOSIS — M545 Low back pain, unspecified: Secondary | ICD-10-CM

## 2013-07-08 DIAGNOSIS — I10 Essential (primary) hypertension: Secondary | ICD-10-CM

## 2013-07-08 DIAGNOSIS — E1142 Type 2 diabetes mellitus with diabetic polyneuropathy: Secondary | ICD-10-CM

## 2013-07-08 DIAGNOSIS — N390 Urinary tract infection, site not specified: Secondary | ICD-10-CM

## 2013-07-08 DIAGNOSIS — E1159 Type 2 diabetes mellitus with other circulatory complications: Secondary | ICD-10-CM

## 2013-07-08 DIAGNOSIS — R319 Hematuria, unspecified: Secondary | ICD-10-CM

## 2013-07-08 DIAGNOSIS — E785 Hyperlipidemia, unspecified: Secondary | ICD-10-CM

## 2013-07-08 DIAGNOSIS — E0840 Diabetes mellitus due to underlying condition with diabetic neuropathy, unspecified: Secondary | ICD-10-CM

## 2013-07-08 DIAGNOSIS — E1349 Other specified diabetes mellitus with other diabetic neurological complication: Secondary | ICD-10-CM

## 2013-07-08 DIAGNOSIS — E1169 Type 2 diabetes mellitus with other specified complication: Secondary | ICD-10-CM

## 2013-07-08 DIAGNOSIS — D509 Iron deficiency anemia, unspecified: Secondary | ICD-10-CM

## 2013-07-08 DIAGNOSIS — Z23 Encounter for immunization: Secondary | ICD-10-CM

## 2013-07-08 LAB — CBC WITH DIFFERENTIAL/PLATELET
BASOS ABS: 0.1 10*3/uL (ref 0.0–0.1)
BASOS PCT: 1 % (ref 0–1)
Eosinophils Absolute: 0.3 10*3/uL (ref 0.0–0.7)
Eosinophils Relative: 5 % (ref 0–5)
HCT: 24.7 % — ABNORMAL LOW (ref 36.0–46.0)
Hemoglobin: 8.6 g/dL — ABNORMAL LOW (ref 12.0–15.0)
Lymphocytes Relative: 36 % (ref 12–46)
Lymphs Abs: 2.2 10*3/uL (ref 0.7–4.0)
MCH: 32 pg (ref 26.0–34.0)
MCHC: 34.8 g/dL (ref 30.0–36.0)
MCV: 91.8 fL (ref 78.0–100.0)
Monocytes Absolute: 0.6 10*3/uL (ref 0.1–1.0)
Monocytes Relative: 10 % (ref 3–12)
NEUTROS ABS: 2.9 10*3/uL (ref 1.7–7.7)
NEUTROS PCT: 48 % (ref 43–77)
PLATELETS: 449 10*3/uL — AB (ref 150–400)
RBC: 2.69 MIL/uL — ABNORMAL LOW (ref 3.87–5.11)
RDW: 14.9 % (ref 11.5–15.5)
WBC: 6 10*3/uL (ref 4.0–10.5)

## 2013-07-08 LAB — POCT URINALYSIS DIPSTICK
Bilirubin, UA: NEGATIVE
Glucose, UA: 250
KETONES UA: NEGATIVE
Nitrite, UA: NEGATIVE
PH UA: 5
RBC UA: POSITIVE
Spec Grav, UA: 1.01
Urobilinogen, UA: NEGATIVE

## 2013-07-08 LAB — POCT GLYCOSYLATED HEMOGLOBIN (HGB A1C): Hemoglobin A1C: 5.7

## 2013-07-08 MED ORDER — FERROUS SULFATE 325 (65 FE) MG PO TABS: 325.0000 mg | ORAL_TABLET | Freq: Every day | ORAL | Status: DC

## 2013-07-08 MED ORDER — SULFAMETHOXAZOLE-TMP DS 800-160 MG PO TABS: 1.0000 | ORAL_TABLET | Freq: Two times a day (BID) | ORAL | Status: DC

## 2013-07-08 MED ORDER — LOSARTAN POTASSIUM-HCTZ 50-12.5 MG PO TABS: 1.0000 | ORAL_TABLET | Freq: Every day | ORAL | Status: DC

## 2013-07-08 MED ORDER — METFORMIN HCL 500 MG PO TABS: ORAL_TABLET | ORAL | Status: DC

## 2013-07-08 MED ORDER — SOLIFENACIN SUCCINATE 5 MG PO TABS: 5.0000 mg | ORAL_TABLET | Freq: Every day | ORAL | Status: DC

## 2013-07-08 MED ORDER — INSULIN GLARGINE 100 UNIT/ML ~~LOC~~ SOLN: 10.0000 [IU] | Freq: Every morning | SUBCUTANEOUS | Status: DC

## 2013-07-08 NOTE — Progress Notes (Signed)
   Subjective:    Patient ID: Christine Silva, female    DOB: 1945-09-04, 68 y.o.   MRN: [id number]  HPI She is here for a general medication recheck. She is having difficulty with low back pain and is presently being followed by orthopedics. They apparently gave her a steroid injection as well as prednisone and muscle relaxer. She still having a great of difficulty with back pain. She also complains of lower abdominal discomfort and hematuria. She does check her blood sugars periodically and continues on metformin and Lantus. In the past we have tried to stop the Lantus with little success. She had an eye exam in March. Ambien was recently done. Her last lipid panel was in November. She did try to get a shingles vaccine however was on the cost her over $200 and she did not get it. She does check her feet periodically. She continues on iron supplementation as well as her other medications.   Review of Systems     Objective:   Physical Exam Alert and in no distress. Hemoglobin A1c is 5.7. Urine microscopic showed wbc's: TNTC       Assessment & Plan:  Blood in urine - Plan: Urinalysis Dipstick  Diabetes mellitus due to underlying condition with diabetic neuropathy - Plan: HgB A1c, metFORMIN (GLUCOPHAGE) 500 MG tablet, insulin glargine (LANTUS) 100 UNIT/ML injection  Need for prophylactic vaccination against Streptococcus pneumoniae (pneumococcus) - Plan: Pneumococcal conjugate vaccine 13-valent  UTI (lower urinary tract infection) - Plan: sulfamethoxazole-trimethoprim (BACTRIM DS) 800-160 MG per tablet, solifenacin (VESICARE) 5 MG tablet  Iron deficiency anemia - Plan: CBC with Differential, ferrous sulfate 325 (65 FE) MG tablet  Hyperlipidemia LDL goal <70  Midline low back pain, with sciatica presence unspecified  Hypertension associated with diabetes - Plan: losartan-hydrochlorothiazide (HYZAAR) 50-12.5 MG per tablet  her medications were renewed. She will continue to be followed by  orthopedics. Recheck her urine in 2 weeks.

## 2013-07-11 ENCOUNTER — Telehealth: Payer: Self-pay | Admitting: Family Medicine

## 2013-07-11 NOTE — Telephone Encounter (Signed)
Patient states she was seen on Monday and given 2 prescriptions. When she went to pharmacy she could only get one of them which was vesicare the other medication was $310.00 and she could not afford. She states she called the office back on Monday and spoke to someone about this but no one ever called her back.  Told patient I would send message to you and that someone will be calling her back today  Also she states she was supposed to come back in 2 weeks, she scheduled follow up for 07/22/13 but does that need to be moved if she has not started all of medications yet?

## 2013-07-11 NOTE — Telephone Encounter (Signed)
Have her check with the pharmacist to see what other drugs that same category are covered

## 2013-07-11 NOTE — Telephone Encounter (Signed)
Several meds were called in. Find out which one she is referring to

## 2013-07-11 NOTE — Telephone Encounter (Signed)
Pt states it is the Vesicare that was $310.00 and she would like to know if there is something that she can take instead that will be cheaper.

## 2013-07-12 ENCOUNTER — Telehealth: Payer: Self-pay

## 2013-07-12 MED ORDER — OXYBUTYNIN CHLORIDE 5 MG PO TABS: 5.0000 mg | ORAL_TABLET | Freq: Two times a day (BID) | ORAL | Status: DC

## 2013-07-12 NOTE — Telephone Encounter (Signed)
Error

## 2013-07-12 NOTE — Telephone Encounter (Signed)
Tell her that I called it in.

## 2013-07-12 NOTE — Telephone Encounter (Signed)
Pt.notified

## 2013-07-12 NOTE — Telephone Encounter (Signed)
Pharmacist wants to know if pt can take Oxybutin 5mg  in place of the Vesicare since the Oxybutin is much cheaper. Please advise

## 2013-07-22 ENCOUNTER — Encounter: Payer: Self-pay | Admitting: Family Medicine

## 2013-07-22 ENCOUNTER — Ambulatory Visit (INDEPENDENT_AMBULATORY_CARE_PROVIDER_SITE_OTHER): Payer: Medicare Other | Admitting: Family Medicine

## 2013-07-22 VITALS — BP 100/50 | Temp 98.9°F

## 2013-07-22 DIAGNOSIS — R319 Hematuria, unspecified: Secondary | ICD-10-CM

## 2013-07-22 DIAGNOSIS — N390 Urinary tract infection, site not specified: Secondary | ICD-10-CM

## 2013-07-22 LAB — POCT URINALYSIS DIPSTICK
Bilirubin, UA: NEGATIVE
Glucose, UA: 250
Ketones, UA: NEGATIVE
Nitrite, UA: POSITIVE
Spec Grav, UA: 1.015
UROBILINOGEN UA: NEGATIVE
pH, UA: 5

## 2013-07-22 MED ORDER — CIPROFLOXACIN HCL 500 MG PO TABS: 500.0000 mg | ORAL_TABLET | Freq: Two times a day (BID) | ORAL | Status: DC

## 2013-07-22 NOTE — Progress Notes (Signed)
   Subjective:    Patient ID: Christine Silva, female    DOB: June 27, 1945, 68 y.o.   MRN: [id number]  HPI Is here for recheck on recent treatment for UTI. She still is having some dysuria but no frequency, fever, chills.   Review of Systems     Objective:   Physical Exam Alert and in no distress. Urine microscopic was loaded with white cells and rods.      Assessment & Plan:  Blood in urine - Plan: POCT Urinalysis Dipstick, Urine culture  UTI (lower urinary tract infection) - Plan: Urine culture, ciprofloxacin (CIPRO) 500 MG tablet  I will call her with results of the culture

## 2013-07-25 LAB — URINE CULTURE: Colony Count: >=100000

## 2013-07-25 MED ORDER — AMOXICILLIN-POT CLAVULANATE 875-125 MG PO TABS: 1.0000 | ORAL_TABLET | Freq: Two times a day (BID) | ORAL | Status: DC

## 2013-07-25 NOTE — Progress Notes (Signed)
   Subjective:    Patient ID: Christine Silva, female    DOB: Aug 16, 1945, 68 y.o.   MRN: [id number]  HPI    Review of Systems     Objective:   Physical Exam        Assessment & Plan:  Culture results came back with Escherichia coli resistant to Cipro and Septra. I will switch her to Augmentin and have her return here in 2 weeks for recheck.

## 2013-07-25 NOTE — Addendum Note (Signed)
Addended by: Denita Lung on: 07/25/2013 08:46 AM   Modules accepted: Orders

## 2013-08-08 ENCOUNTER — Other Ambulatory Visit (INDEPENDENT_AMBULATORY_CARE_PROVIDER_SITE_OTHER): Payer: Medicare Other

## 2013-08-08 ENCOUNTER — Telehealth: Payer: Self-pay | Admitting: Family Medicine

## 2013-08-08 DIAGNOSIS — R319 Hematuria, unspecified: Secondary | ICD-10-CM

## 2013-08-08 LAB — POCT URINALYSIS DIPSTICK
BILIRUBIN UA: NEGATIVE
GLUCOSE UA: 100
Ketones, UA: NEGATIVE
Nitrite, UA: NEGATIVE
PH UA: 6
Protein, UA: NEGATIVE
RBC UA: NEGATIVE
Spec Grav, UA: 1.01
UROBILINOGEN UA: NEGATIVE

## 2013-08-08 NOTE — Telephone Encounter (Signed)
Pt came in to have urine rechecked and wanted me to give you updates on what has been going on with her. She states she went to see ortho. And they gave her two medications. They are oxycod/acetamin 5-325 mg two tablets every six hours as needed for pain. They also gave her diazepam 5 mg take one every 6 hours as needed for spasms. She states they also gave her an MRI. She has a follow up appt with Dr. Mardelle Matte on Monday. She was very concerned and she wanted to make sure you knew what was going on.

## 2013-08-13 ENCOUNTER — Other Ambulatory Visit: Payer: Self-pay | Admitting: Family Medicine

## 2013-08-13 NOTE — Telephone Encounter (Signed)
IS THIS OKAY 

## 2013-08-20 ENCOUNTER — Other Ambulatory Visit: Payer: Self-pay | Admitting: Family Medicine

## 2013-09-01 IMAGING — CR DG KNEE 1-2V PORT*L*
2 series · 2 of 2 positions shown · non-contrast
Comparison: 04/29/2011.

CLINICAL DATA: Post arthroplasty.

PORTABLE LEFT KNEE - 1-2 VIEW

[AP]
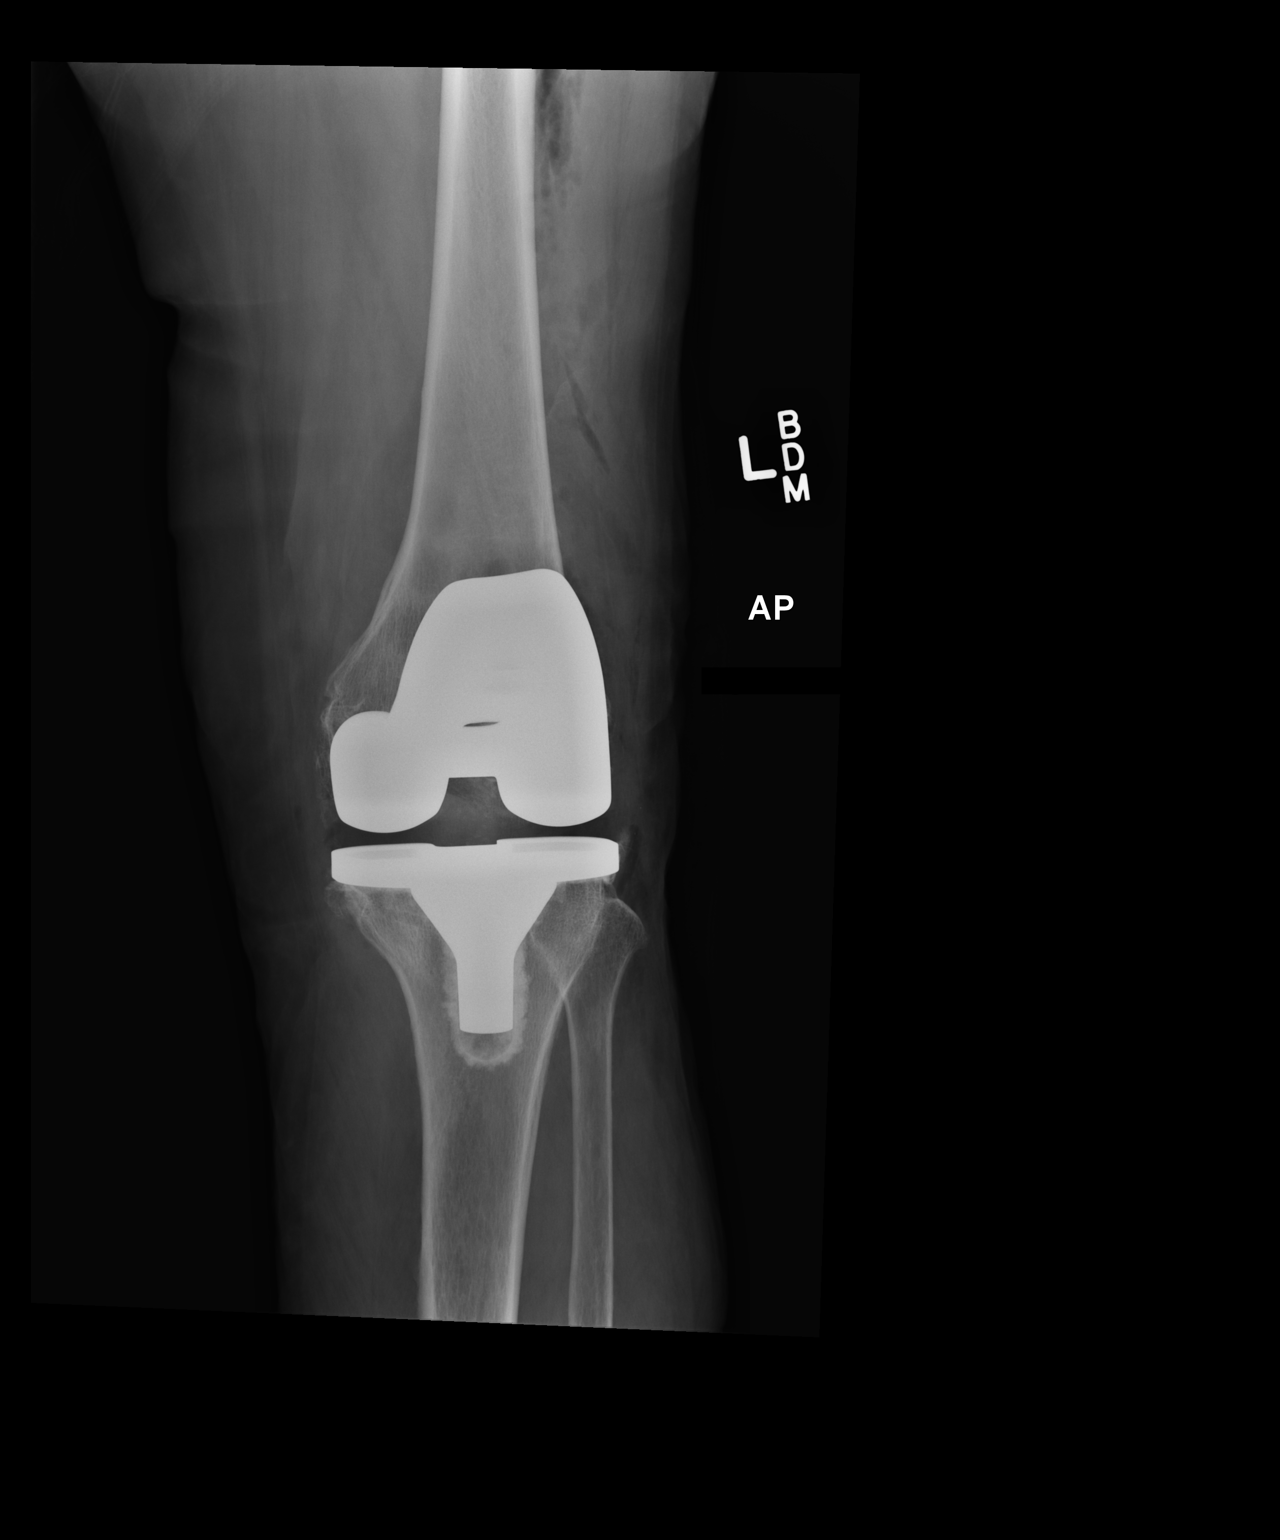

[xtable lateral]
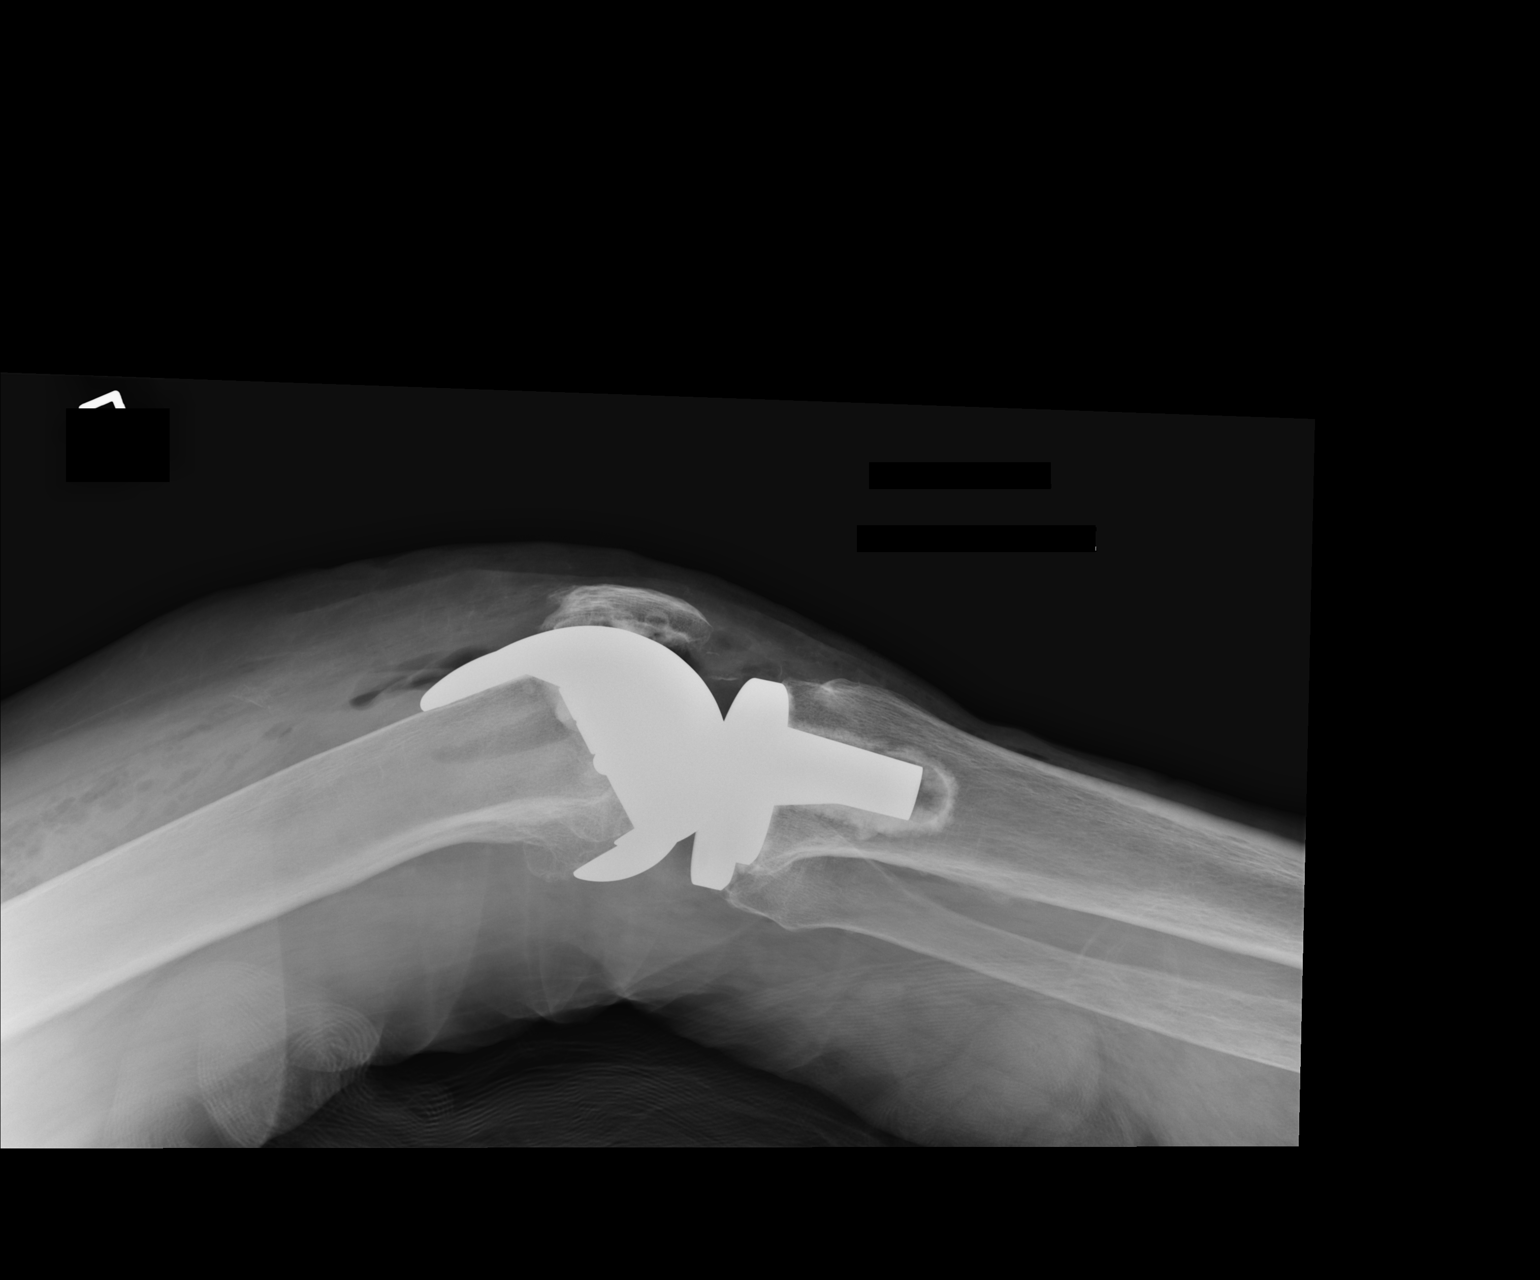

[2 of 2 positions shown; findings below may reference images not displayed]

FINDINGS: Portable AP and lateral images are submitted.  The left
knee arthroplasty has been performed.  There is expected alignment
of the hardware components with no evidence of dislocation or
destruction.  Edema and air are present at the operative sites.
IMPRESSION: Post left knee arthroplasty.  Expected alignment.  No evidence of
disruption of hardware.

## 2013-09-24 ENCOUNTER — Ambulatory Visit (INDEPENDENT_AMBULATORY_CARE_PROVIDER_SITE_OTHER): Payer: Medicare Other | Admitting: Family Medicine

## 2013-09-24 ENCOUNTER — Encounter: Payer: Self-pay | Admitting: Family Medicine

## 2013-09-24 VITALS — BP 100/60 | HR 60 | Wt 156.0 lb

## 2013-09-24 DIAGNOSIS — R300 Dysuria: Secondary | ICD-10-CM

## 2013-09-24 LAB — POCT URINALYSIS DIPSTICK
GLUCOSE UA: 1000
Ketones, UA: NEGATIVE
SPEC GRAV UA: 1.015
Urobilinogen, UA: NEGATIVE
pH, UA: 5

## 2013-09-24 MED ORDER — AMOXICILLIN-POT CLAVULANATE 875-125 MG PO TABS: 1.0000 | ORAL_TABLET | Freq: Two times a day (BID) | ORAL | Status: DC

## 2013-09-24 NOTE — Progress Notes (Signed)
   Subjective:    Patient ID: Christine Silva, female    DOB: 18-Apr-1945, 68 y.o.   MRN: [id number]  HPI She is again evident difficulty with urgency and dysuria. She did have difficulty with UTI and did grow Escherichia coli. She was placed on the antibiotic but did not followup as directed.  Review of Systems     Objective:   Physical Exam Alert and in no distress. Urine microscopic did show bacteria but no y cells.       Assessment & Plan:  Burning with urination - Plan: POCT Urinalysis Dipstick, amoxicillin-clavulanate (AUGMENTIN) 875-125 MG per tablet, CULTURE, URINE COMPREHENSIVE  I will followup pending the results of this and have her come back for followup on her underlying diabetes.

## 2013-10-02 ENCOUNTER — Encounter: Payer: Self-pay | Admitting: Family Medicine

## 2013-10-02 ENCOUNTER — Ambulatory Visit (INDEPENDENT_AMBULATORY_CARE_PROVIDER_SITE_OTHER): Payer: Medicare Other | Admitting: Family Medicine

## 2013-10-02 VITALS — BP 100/50 | HR 74 | Ht 63.0 in | Wt 152.0 lb

## 2013-10-02 DIAGNOSIS — E1169 Type 2 diabetes mellitus with other specified complication: Secondary | ICD-10-CM

## 2013-10-02 DIAGNOSIS — Z23 Encounter for immunization: Secondary | ICD-10-CM

## 2013-10-02 DIAGNOSIS — I152 Hypertension secondary to endocrine disorders: Secondary | ICD-10-CM

## 2013-10-02 DIAGNOSIS — E785 Hyperlipidemia, unspecified: Secondary | ICD-10-CM

## 2013-10-02 DIAGNOSIS — Z79899 Other long term (current) drug therapy: Secondary | ICD-10-CM

## 2013-10-02 DIAGNOSIS — E119 Type 2 diabetes mellitus without complications: Secondary | ICD-10-CM

## 2013-10-02 DIAGNOSIS — E1159 Type 2 diabetes mellitus with other circulatory complications: Secondary | ICD-10-CM

## 2013-10-02 DIAGNOSIS — I10 Essential (primary) hypertension: Secondary | ICD-10-CM

## 2013-10-02 LAB — LIPID PANEL
CHOL/HDL RATIO: 2.8 ratio
Cholesterol: 131 mg/dL (ref 0–200)
HDL: 47 mg/dL (ref >39)
LDL Cholesterol: 63 mg/dL (ref 0–99)
TRIGLYCERIDES: 107 mg/dL (ref <150)
VLDL: 21 mg/dL (ref 0–40)

## 2013-10-02 LAB — COMPREHENSIVE METABOLIC PANEL
ALK PHOS: 65 U/L (ref 39–117)
ALT: 14 U/L (ref 0–35)
AST: 20 U/L (ref 0–37)
Albumin: 4.3 g/dL (ref 3.5–5.2)
BILIRUBIN TOTAL: 1.6 mg/dL — AB (ref 0.2–1.2)
BUN: 11 mg/dL (ref 6–23)
CO2: 29 mEq/L (ref 19–32)
CREATININE: 0.73 mg/dL (ref 0.50–1.10)
Calcium: 10 mg/dL (ref 8.4–10.5)
Chloride: 103 mEq/L (ref 96–112)
GLUCOSE: 147 mg/dL — AB (ref 70–99)
Potassium: 4.2 mEq/L (ref 3.5–5.3)
Sodium: 139 mEq/L (ref 135–145)
Total Protein: 6.3 g/dL (ref 6.0–8.3)

## 2013-10-02 LAB — POCT UA - MICROALBUMIN
Albumin/Creatinine Ratio, Urine, POC: 16.2
Creatinine, POC: 140.4 mg/dL
MICROALBUMIN (UR) POC: 22.7 mg/L

## 2013-10-02 LAB — POCT GLYCOSYLATED HEMOGLOBIN (HGB A1C): Hemoglobin A1C: 5.1

## 2013-10-02 LAB — POCT URINALYSIS DIPSTICK
Bilirubin, UA: NEGATIVE
Blood, UA: NEGATIVE
CLARITY UA: NEGATIVE
Color, UA: NEGATIVE
Glucose, UA: NEGATIVE
Ketones, UA: NEGATIVE
Leukocytes, UA: NEGATIVE
NITRITE UA: NEGATIVE
PH UA: 5
PROTEIN UA: NEGATIVE
Spec Grav, UA: 1.015
UROBILINOGEN UA: NEGATIVE

## 2013-10-02 MED ORDER — METFORMIN HCL 1000 MG PO TABS: 1000.0000 mg | ORAL_TABLET | Freq: Two times a day (BID) | ORAL | Status: DC

## 2013-10-02 NOTE — Patient Instructions (Addendum)
Reduce your insulin to 5 units a day and start on the new metformin dose. If you have trouble with diarrhea let me know. Take the metformin that you have at home and to take it twice per day until it runs out. If you start getting weak and dizzy check your blood sugar. We might need to stop the insulin entirely

## 2013-10-02 NOTE — Progress Notes (Signed)
Subjective:    Christine Silva is a 68 y.o. female who presents for follow-up of Type 2 diabetes mellitus.  In the past we have tried stopping the insulin however her blood sugars did go quite high.  Home blood sugar records: PT TEST BID 130 TO 160  Current symptoms/problems NONE Daily foot checks: yesAny foot concerns: NONE Last eye exam:03/21/13     Medication compliance: Good Current diet: NONE Current exercise: WHEN SHE FEELS LIKE IT Known diabetic complications: none Cardiovascular risk factors: advanced age (older than 89 for men, 68 for women), diabetes mellitus, dyslipidemia, hypertension and sedentary lifestyle   The following portions of the patient's history were reviewed and updated as appropriate: allergies, current medications, past family history, past medical history, past social history, past surgical history and problem list.  ROS as in subjective above    Objective:    There were no vitals taken for this visit.  There were no vitals filed for this visit.  General appearence: alert, no distress, WD/WN Neck: supple, no lymphadenopathy, no thyromegaly, no masses Heart: RRR, normal S1, S2, no murmurs Lungs: CTA bilaterally, no wheezes, rhonchi, or rales Abdomen: +bs, soft, non tender, non distended, no masses, no hepatomegaly, no splenomegaly Pulses: 2+ symmetric, upper and lower extremities, normal cap refill Ext: no edema Foot exam:  Neuro: foot monofilament exam normal   Lab Review Lab Results  Component Value Date   HGBA1C 5.7 07/08/2013   Lab Results  Component Value Date   CHOL 145 11/26/2012   HDL 46 11/26/2012   LDLCALC 80 11/26/2012   TRIG 95 11/26/2012   CHOLHDL 3.2 11/26/2012   No results found for this basenameDerl Barrow     Chemistry      Component Value Date/Time   NA 136 11/26/2012 1131   K 4.3 11/26/2012 1131   CL 101 11/26/2012 1131   CO2 26 11/26/2012 1131   BUN 16 11/26/2012 1131   CREATININE 0.66 11/26/2012  1131   CREATININE 0.72 05/02/2012 0610      Component Value Date/Time   CALCIUM 9.6 11/26/2012 1131   ALKPHOS 71 11/26/2012 1131   AST 19 11/26/2012 1131   ALT 13 11/26/2012 1131   BILITOT 1.6* 11/26/2012 1131        Chemistry      Component Value Date/Time   NA 136 11/26/2012 1131   K 4.3 11/26/2012 1131   CL 101 11/26/2012 1131   CO2 26 11/26/2012 1131   BUN 16 11/26/2012 1131   CREATININE 0.66 11/26/2012 1131   CREATININE 0.72 05/02/2012 0610      Component Value Date/Time   CALCIUM 9.6 11/26/2012 1131   ALKPHOS 71 11/26/2012 1131   AST 19 11/26/2012 1131   ALT 13 11/26/2012 1131   BILITOT 1.6* 11/26/2012 1131       Hemoglobin A1c 5.1   Assessment:   Type II or unspecified type diabetes mellitus without mention of complication, not stated as uncontrolled - Plan: HM DIABETES FOOT EXAM, metFORMIN (GLUCOPHAGE) 1000 MG tablet, HM DIABETES FOOT EXAM, POCT glycosylated hemoglobin (Hb A1C), POCT Urinalysis Dipstick, POCT UA - Microalbumin  Need for prophylactic vaccination against Streptococcus pneumoniae (pneumococcus) - Plan: Pneumococcal polysaccharide vaccine 23-valent greater than or equal to 2yo subcutaneous/IM  Hypertension associated with diabetes - Plan: Comprehensive metabolic panel  Hyperlipidemia LDL goal <70 - Plan: Lipid panel  Encounter for long-term (current) use of other medications         Plan:    1.  Rx changes: I will have her cut back on her insulin to 5 units and increase metformin to 1000 twice a day. Discussed possible GI side effects. Discussed the need for her to watch her blood sugars especially low blood sugar. 2.  Education: Reviewed 'ABCs' of diabetes management (respective goals in parentheses):  A1C (<7), blood pressure (<130/80), and cholesterol (LDL <100). 3.  Compliance at present is estimated to be good. Efforts to improve compliance (if necessary) will be directed at -. 4. Follow up: 4 months  She is reluctant to stop her  insulin due to previous difficulty with elevated blood sugars. I will have her cut back on her insulin to 5 units and monitor her blood sugars. Increase metformin. Discussed possible side effects from metformin Discussed low blood sugar readings with her. She will also be given a prescription for Zostavax and recommended she go to the drugstore to get her flu shot. She will call if any difficulties.

## 2013-10-04 ENCOUNTER — Other Ambulatory Visit: Payer: Self-pay | Admitting: Family Medicine

## 2013-10-07 ENCOUNTER — Telehealth: Payer: Self-pay | Admitting: Family Medicine

## 2013-10-08 NOTE — Telephone Encounter (Signed)
LEFT WORD FOR WORD MESSAGE ON PT MACHINE

## 2013-10-08 NOTE — Telephone Encounter (Signed)
Have her come in so we can discuss this further. This is not a normal side effect from the medication.

## 2013-11-07 ENCOUNTER — Ambulatory Visit (INDEPENDENT_AMBULATORY_CARE_PROVIDER_SITE_OTHER): Payer: Medicare Other | Admitting: Family Medicine

## 2013-11-07 ENCOUNTER — Other Ambulatory Visit: Payer: Self-pay

## 2013-11-07 VITALS — Wt 152.0 lb

## 2013-11-07 DIAGNOSIS — M1990 Unspecified osteoarthritis, unspecified site: Secondary | ICD-10-CM

## 2013-11-07 DIAGNOSIS — I10 Essential (primary) hypertension: Secondary | ICD-10-CM

## 2013-11-07 DIAGNOSIS — E1169 Type 2 diabetes mellitus with other specified complication: Secondary | ICD-10-CM

## 2013-11-07 DIAGNOSIS — E1159 Type 2 diabetes mellitus with other circulatory complications: Secondary | ICD-10-CM

## 2013-11-07 DIAGNOSIS — I152 Hypertension secondary to endocrine disorders: Secondary | ICD-10-CM

## 2013-11-07 DIAGNOSIS — E0840 Diabetes mellitus due to underlying condition with diabetic neuropathy, unspecified: Secondary | ICD-10-CM

## 2013-11-07 MED ORDER — PRAVASTATIN SODIUM 40 MG PO TABS: ORAL_TABLET | ORAL | Status: DC

## 2013-11-07 MED ORDER — TRAZODONE HCL 50 MG PO TABS: ORAL_TABLET | ORAL | Status: DC

## 2013-11-07 MED ORDER — LOSARTAN POTASSIUM-HCTZ 50-12.5 MG PO TABS: 1.0000 | ORAL_TABLET | Freq: Every day | ORAL | Status: DC

## 2013-11-07 NOTE — Progress Notes (Signed)
Patient said she could not take the 1000 mg metformin BID because it upset her stomach so patient has cut 1000 mg tab in half and is taking  500 mg BID and also she is taking her 10 units of lantus in the mornings/ when pt was on 1000 mg BID her B/S went to the 300's and she was having blood in her stool and since she has went back to the way she use to take meds she states her B/S has gone back down normal and no longer has blood in stool

## 2013-11-07 NOTE — Patient Instructions (Signed)
Eat more frequent smaller meals and check your blood sugars two hour after a meal

## 2013-11-07 NOTE — Progress Notes (Signed)
   Subjective:    Patient ID: Christine Silva, female    DOB: [birth year], 68 y.o.   MRN: [id number]  HPI She is here for recheck. Presently she is on 10 units of Lantus as well as 500 twice a day of metformin. She states her blood sugars usually run in the low 100s with occasional bouts will higher than that. She does eat 2 meals per day. Presently she is quite happy with her regimen. She did have difficulty in the past with blood sugars getting quite high when she was not on insulin. She also complains of some right knee discomfort.   Review of Systems     Objective:   Physical Exam Alert and in no distress otherwise not examined       Assessment & Plan:  Diabetes mellitus due to underlying condition with diabetic neuropathy  Hypertension associated with diabetes  Arthritis  I encouraged her to continue with her present medication regimen. Did encourage her to have more frequent smaller meals. Conservative care for the arthritis symptoms mainly using Tylenol.

## 2014-01-14 ENCOUNTER — Other Ambulatory Visit: Payer: Self-pay | Admitting: Family Medicine

## 2014-01-27 NOTE — Progress Notes (Signed)
Can you see if she has had a DEXA

## 2014-02-10 ENCOUNTER — Telehealth: Payer: Self-pay | Admitting: Internal Medicine

## 2014-02-10 MED ORDER — OXYBUTYNIN CHLORIDE 5 MG PO TABS: 5.0000 mg | ORAL_TABLET | Freq: Two times a day (BID) | ORAL | Status: DC

## 2014-02-10 NOTE — Telephone Encounter (Signed)
Pt is [place] and needing her refill sent up there instead of here. Looks like she has one refill left so i will just send that one refill to [facility]

## 2014-03-03 ENCOUNTER — Other Ambulatory Visit: Payer: Self-pay | Admitting: Family Medicine

## 2014-03-03 NOTE — Telephone Encounter (Signed)
Is this okay to refill? 

## 2014-03-04 ENCOUNTER — Other Ambulatory Visit: Payer: Self-pay

## 2014-03-10 ENCOUNTER — Other Ambulatory Visit: Payer: Self-pay | Admitting: Family Medicine

## 2014-03-28 ENCOUNTER — Telehealth: Payer: Self-pay | Admitting: Family Medicine

## 2014-03-28 NOTE — Telephone Encounter (Signed)
Pt requesting refill on Trazodone 5omg, Metformin 1000mg , Pravastatin 40mg  specifically and pt also state she need refill "all other meds that she takes because she can't remember the names of all of them". Pt says she is still out of town in [place] so need refills sent to [facility] on [address], in [place] and she will make an appointment when she returns

## 2014-03-29 NOTE — Telephone Encounter (Signed)
Help her out

## 2014-03-31 ENCOUNTER — Other Ambulatory Visit: Payer: Self-pay

## 2014-03-31 DIAGNOSIS — N390 Urinary tract infection, site not specified: Secondary | ICD-10-CM

## 2014-03-31 DIAGNOSIS — I10 Essential (primary) hypertension: Secondary | ICD-10-CM

## 2014-03-31 DIAGNOSIS — D509 Iron deficiency anemia, unspecified: Secondary | ICD-10-CM

## 2014-03-31 DIAGNOSIS — E0840 Diabetes mellitus due to underlying condition with diabetic neuropathy, unspecified: Secondary | ICD-10-CM

## 2014-03-31 DIAGNOSIS — E1159 Type 2 diabetes mellitus with other circulatory complications: Secondary | ICD-10-CM

## 2014-03-31 MED ORDER — INSULIN GLARGINE 100 UNIT/ML ~~LOC~~ SOLN: 10.0000 [IU] | Freq: Every morning | SUBCUTANEOUS | Status: AC

## 2014-03-31 MED ORDER — FERROUS SULFATE 325 (65 FE) MG PO TABS: 325.0000 mg | ORAL_TABLET | Freq: Every day | ORAL | Status: DC

## 2014-03-31 MED ORDER — SOLIFENACIN SUCCINATE 5 MG PO TABS: 5.0000 mg | ORAL_TABLET | Freq: Every day | ORAL | Status: AC

## 2014-03-31 MED ORDER — LOSARTAN POTASSIUM-HCTZ 50-12.5 MG PO TABS: 1.0000 | ORAL_TABLET | Freq: Every day | ORAL | Status: AC

## 2014-03-31 MED ORDER — PRAVASTATIN SODIUM 40 MG PO TABS: 40.0000 mg | ORAL_TABLET | Freq: Every day | ORAL | Status: AC

## 2014-03-31 MED ORDER — OXYBUTYNIN CHLORIDE 5 MG PO TABS: ORAL_TABLET | ORAL | Status: AC

## 2014-03-31 MED ORDER — TRAZODONE HCL 50 MG PO TABS: ORAL_TABLET | ORAL | Status: AC

## 2014-03-31 MED ORDER — METFORMIN HCL 1000 MG PO TABS: 1000.0000 mg | ORAL_TABLET | Freq: Two times a day (BID) | ORAL | Status: DC

## 2014-03-31 NOTE — Telephone Encounter (Signed)
Done sent in all meds and called in trazodone

## 2014-04-02 ENCOUNTER — Other Ambulatory Visit: Payer: Self-pay | Admitting: Family Medicine

## 2014-04-03 ENCOUNTER — Telehealth: Payer: Self-pay | Admitting: Family Medicine

## 2014-04-03 NOTE — Telephone Encounter (Signed)
Patient informed and will call ins.

## 2014-04-03 NOTE — Telephone Encounter (Signed)
Pt called stating that Vesicare was sent to pharmacy and it will cost $228 for this med. Pt wants to know what this med is for and if she needs it because she would like to get an alternate med that less expensive if possible

## 2014-04-03 NOTE — Telephone Encounter (Signed)
It his for her overactive bladder. Her check with her insurance carrier to see which one is cheaper

## 2014-04-04 ENCOUNTER — Telehealth: Payer: Self-pay | Admitting: Family Medicine

## 2014-04-04 NOTE — Telephone Encounter (Signed)
Pt called and stated that she doesn't understand her medication. She wants to know if she should be on both ditropan and vesicare. She thought they were both for the same thing and doesn't understand why she needs to be on both. Please call and advise.

## 2014-04-05 NOTE — Telephone Encounter (Signed)
The Vesicare was too expensive for her according to her. Have her just use the Ditropan for her bladder

## 2014-04-07 ENCOUNTER — Other Ambulatory Visit: Payer: Self-pay

## 2014-04-07 NOTE — Telephone Encounter (Signed)
Called patient informed her to use oxybutain patient verbalized understanding

## 2014-05-07 ENCOUNTER — Other Ambulatory Visit: Payer: Self-pay | Admitting: Family Medicine

## 2014-05-07 NOTE — Telephone Encounter (Signed)
Ok

## 2014-05-07 NOTE — Telephone Encounter (Signed)
Pt called and stated that she is still in [place]. Her sister is not doing very well. She is not sure when she will be back in [place]. She needs all med refill to [facility] in [place].

## 2014-07-02 ENCOUNTER — Other Ambulatory Visit: Payer: Self-pay | Admitting: Family Medicine

## 2014-09-03 ENCOUNTER — Other Ambulatory Visit: Payer: Self-pay | Admitting: Family Medicine
